# Patient Record
Sex: Male | Born: 1950 | Race: White | Hispanic: No | Marital: Married | Smoking: Former smoker
Health system: Southern US, Community
[De-identification: ages and names within clinical notes are randomized; demographics above are authoritative.]

## PROBLEM LIST (undated history)

## (undated) DIAGNOSIS — K5792 Diverticulitis of intestine, part unspecified, without perforation or abscess without bleeding: Secondary | ICD-10-CM

## (undated) DIAGNOSIS — M1990 Unspecified osteoarthritis, unspecified site: Secondary | ICD-10-CM

---

## 2004-02-08 ENCOUNTER — Emergency Department (HOSPITAL_COMMUNITY): Admission: EM | Admit: 2004-02-08 | Discharge: 2004-02-09 | Payer: Self-pay | Admitting: Emergency Medicine

## 2014-09-25 ENCOUNTER — Ambulatory Visit (INDEPENDENT_AMBULATORY_CARE_PROVIDER_SITE_OTHER): Payer: Medicare Other | Admitting: Otolaryngology

## 2014-09-25 DIAGNOSIS — J343 Hypertrophy of nasal turbinates: Secondary | ICD-10-CM | POA: Diagnosis not present

## 2014-09-25 DIAGNOSIS — J342 Deviated nasal septum: Secondary | ICD-10-CM

## 2014-09-25 DIAGNOSIS — J310 Chronic rhinitis: Secondary | ICD-10-CM | POA: Diagnosis not present

## 2014-09-25 DIAGNOSIS — J321 Chronic frontal sinusitis: Secondary | ICD-10-CM | POA: Diagnosis not present

## 2014-09-26 ENCOUNTER — Other Ambulatory Visit (INDEPENDENT_AMBULATORY_CARE_PROVIDER_SITE_OTHER): Payer: Self-pay | Admitting: Otolaryngology

## 2014-09-26 DIAGNOSIS — J329 Chronic sinusitis, unspecified: Secondary | ICD-10-CM

## 2014-10-03 ENCOUNTER — Ambulatory Visit (HOSPITAL_COMMUNITY)
Admission: RE | Admit: 2014-10-03 | Discharge: 2014-10-03 | Disposition: A | Discharge status: Home / Self Care | Payer: Medicare Other | Source: Ambulatory Visit | Attending: Otolaryngology | Admitting: Otolaryngology

## 2014-10-03 DIAGNOSIS — J329 Chronic sinusitis, unspecified: Secondary | ICD-10-CM | POA: Diagnosis not present

## 2014-10-03 DIAGNOSIS — R51 Headache: Secondary | ICD-10-CM | POA: Diagnosis not present

## 2014-10-16 ENCOUNTER — Ambulatory Visit (INDEPENDENT_AMBULATORY_CARE_PROVIDER_SITE_OTHER): Payer: Medicare Other | Admitting: Otolaryngology

## 2014-10-16 DIAGNOSIS — J310 Chronic rhinitis: Secondary | ICD-10-CM

## 2014-10-16 DIAGNOSIS — J343 Hypertrophy of nasal turbinates: Secondary | ICD-10-CM | POA: Diagnosis not present

## 2019-04-17 ENCOUNTER — Other Ambulatory Visit: Payer: Self-pay | Admitting: *Deleted

## 2019-04-17 DIAGNOSIS — Z20822 Contact with and (suspected) exposure to covid-19: Secondary | ICD-10-CM

## 2019-04-19 ENCOUNTER — Telehealth: Payer: Self-pay | Admitting: General Practice

## 2019-04-19 LAB — NOVEL CORONAVIRUS, NAA: SARS-CoV-2, NAA: NOT DETECTED

## 2019-04-19 NOTE — Telephone Encounter (Signed)
Negative COVID results given. Patient results "NOT Detected." Caller expressed understanding. ° °

## 2021-01-10 ENCOUNTER — Inpatient Hospital Stay (HOSPITAL_COMMUNITY)
Admission: EM | Admit: 2021-01-10 | Discharge: 2021-01-18 | DRG: 329 | Disposition: A | Discharge status: Home / Self Care | Payer: Medicare Other | Attending: General Surgery | Admitting: General Surgery

## 2021-01-10 ENCOUNTER — Other Ambulatory Visit: Payer: Self-pay

## 2021-01-10 ENCOUNTER — Emergency Department (HOSPITAL_COMMUNITY): Payer: Medicare Other

## 2021-01-10 ENCOUNTER — Encounter (HOSPITAL_COMMUNITY): Payer: Self-pay | Admitting: Emergency Medicine

## 2021-01-10 DIAGNOSIS — Z87891 Personal history of nicotine dependence: Secondary | ICD-10-CM

## 2021-01-10 DIAGNOSIS — Z20822 Contact with and (suspected) exposure to covid-19: Secondary | ICD-10-CM | POA: Diagnosis present

## 2021-01-10 DIAGNOSIS — D62 Acute posthemorrhagic anemia: Secondary | ICD-10-CM

## 2021-01-10 DIAGNOSIS — K661 Hemoperitoneum: Secondary | ICD-10-CM | POA: Diagnosis present

## 2021-01-10 DIAGNOSIS — K5730 Diverticulosis of large intestine without perforation or abscess without bleeding: Secondary | ICD-10-CM | POA: Diagnosis present

## 2021-01-10 DIAGNOSIS — Z801 Family history of malignant neoplasm of trachea, bronchus and lung: Secondary | ICD-10-CM

## 2021-01-10 DIAGNOSIS — M1990 Unspecified osteoarthritis, unspecified site: Secondary | ICD-10-CM | POA: Diagnosis present

## 2021-01-10 DIAGNOSIS — Z886 Allergy status to analgesic agent status: Secondary | ICD-10-CM

## 2021-01-10 DIAGNOSIS — R1900 Intra-abdominal and pelvic swelling, mass and lump, unspecified site: Secondary | ICD-10-CM | POA: Diagnosis not present

## 2021-01-10 DIAGNOSIS — R109 Unspecified abdominal pain: Secondary | ICD-10-CM

## 2021-01-10 DIAGNOSIS — C49A3 Gastrointestinal stromal tumor of small intestine: Secondary | ICD-10-CM | POA: Diagnosis not present

## 2021-01-10 DIAGNOSIS — K5900 Constipation, unspecified: Secondary | ICD-10-CM | POA: Diagnosis not present

## 2021-01-10 DIAGNOSIS — R911 Solitary pulmonary nodule: Secondary | ICD-10-CM

## 2021-01-10 DIAGNOSIS — R55 Syncope and collapse: Secondary | ICD-10-CM | POA: Diagnosis present

## 2021-01-10 DIAGNOSIS — Z79899 Other long term (current) drug therapy: Secondary | ICD-10-CM

## 2021-01-10 DIAGNOSIS — Z8041 Family history of malignant neoplasm of ovary: Secondary | ICD-10-CM

## 2021-01-10 DIAGNOSIS — Z7982 Long term (current) use of aspirin: Secondary | ICD-10-CM

## 2021-01-10 DIAGNOSIS — R1903 Right lower quadrant abdominal swelling, mass and lump: Secondary | ICD-10-CM

## 2021-01-10 HISTORY — DX: Diverticulitis of intestine, part unspecified, without perforation or abscess without bleeding: K57.92

## 2021-01-10 HISTORY — DX: Unspecified osteoarthritis, unspecified site: M19.90

## 2021-01-10 LAB — CBC WITH DIFFERENTIAL/PLATELET
Abs Immature Granulocytes: 0.08 10*3/uL — ABNORMAL HIGH (ref 0.00–0.07)
Basophils Absolute: 0.1 10*3/uL (ref 0.0–0.1)
Basophils Relative: 1 %
Eosinophils Absolute: 0.2 10*3/uL (ref 0.0–0.5)
Eosinophils Relative: 1 %
HCT: 38.8 % — ABNORMAL LOW (ref 39.0–52.0)
Hemoglobin: 12 g/dL — ABNORMAL LOW (ref 13.0–17.0)
Immature Granulocytes: 1 %
Lymphocytes Relative: 17 %
Lymphs Abs: 2.6 10*3/uL (ref 0.7–4.0)
MCH: 22.4 pg — ABNORMAL LOW (ref 26.0–34.0)
MCHC: 30.9 g/dL (ref 30.0–36.0)
MCV: 72.5 fL — ABNORMAL LOW (ref 80.0–100.0)
Monocytes Absolute: 0.9 10*3/uL (ref 0.1–1.0)
Monocytes Relative: 6 %
Neutro Abs: 11.5 10*3/uL — ABNORMAL HIGH (ref 1.7–7.7)
Neutrophils Relative %: 74 %
Platelets: 268 10*3/uL (ref 150–400)
RBC: 5.35 MIL/uL (ref 4.22–5.81)
RDW: 14.5 % (ref 11.5–15.5)
WBC: 15.3 10*3/uL — ABNORMAL HIGH (ref 4.0–10.5)
nRBC: 0 % (ref 0.0–0.2)

## 2021-01-10 LAB — COMPREHENSIVE METABOLIC PANEL
ALT: 16 U/L (ref 0–44)
AST: 35 U/L (ref 15–41)
Albumin: 3.5 g/dL (ref 3.5–5.0)
Alkaline Phosphatase: 51 U/L (ref 38–126)
Anion gap: 6 (ref 5–15)
BUN: 16 mg/dL (ref 8–23)
CO2: 19 mmol/L — ABNORMAL LOW (ref 22–32)
Calcium: 8.4 mg/dL — ABNORMAL LOW (ref 8.9–10.3)
Chloride: 111 mmol/L (ref 98–111)
Creatinine, Ser: 1.25 mg/dL — ABNORMAL HIGH (ref 0.61–1.24)
GFR, Estimated: >60 mL/min (ref >60)
Glucose, Bld: 158 mg/dL — ABNORMAL HIGH (ref 70–99)
Potassium: 4.4 mmol/L (ref 3.5–5.1)
Sodium: 136 mmol/L (ref 135–145)
Total Bilirubin: 0.7 mg/dL (ref 0.3–1.2)
Total Protein: 6.2 g/dL — ABNORMAL LOW (ref 6.5–8.1)

## 2021-01-10 LAB — RESP PANEL BY RT-PCR (FLU A&B, COVID) ARPGX2
Influenza A by PCR: NEGATIVE
Influenza B by PCR: NEGATIVE
SARS Coronavirus 2 by RT PCR: NEGATIVE

## 2021-01-10 LAB — LIPASE, BLOOD: Lipase: 31 U/L (ref 11–51)

## 2021-01-10 LAB — URINALYSIS, ROUTINE W REFLEX MICROSCOPIC
Bilirubin Urine: NEGATIVE
Glucose, UA: NEGATIVE mg/dL
Hgb urine dipstick: NEGATIVE
Ketones, ur: NEGATIVE mg/dL
Leukocytes,Ua: NEGATIVE
Nitrite: NEGATIVE
Protein, ur: NEGATIVE mg/dL
Specific Gravity, Urine: >1.046 — ABNORMAL HIGH (ref 1.005–1.030)
pH: 5 (ref 5.0–8.0)

## 2021-01-10 LAB — POC OCCULT BLOOD, ED: Fecal Occult Bld: NEGATIVE

## 2021-01-10 LAB — TROPONIN I (HIGH SENSITIVITY): Troponin I (High Sensitivity): 5 ng/L (ref <18)

## 2021-01-10 MED ORDER — SODIUM CHLORIDE 0.9 % IV SOLN: INTRAVENOUS | Status: AC

## 2021-01-10 MED ORDER — ONDANSETRON HCL 4 MG PO TABS: 4.0000 mg | ORAL_TABLET | Freq: Four times a day (QID) | ORAL | Status: DC | PRN

## 2021-01-10 MED ORDER — ACETAMINOPHEN 650 MG RE SUPP: 650.0000 mg | Freq: Four times a day (QID) | RECTAL | Status: DC | PRN

## 2021-01-10 MED ORDER — ACETAMINOPHEN 325 MG PO TABS: 650.0000 mg | ORAL_TABLET | Freq: Four times a day (QID) | ORAL | Status: DC | PRN

## 2021-01-10 MED ORDER — POLYETHYLENE GLYCOL 3350 17 G PO PACK
17.0000 g | PACK | Freq: Every day | ORAL | Status: DC | PRN
Start: 2021-01-10 — End: 2021-01-12

## 2021-01-10 MED ORDER — ONDANSETRON HCL 4 MG/2ML IJ SOLN: 4.0000 mg | Freq: Four times a day (QID) | INTRAMUSCULAR | Status: DC | PRN

## 2021-01-10 MED ORDER — FLUTICASONE PROPIONATE 50 MCG/ACT NA SUSP
1.0000 | Freq: Every day | NASAL | Status: DC
  Administered 2021-01-16 – 2021-01-18 (×3): 1 via NASAL
  Filled 2021-01-10: qty 16

## 2021-01-10 MED ORDER — IOHEXOL 300 MG/ML  SOLN
100.0000 mL | Freq: Once | INTRAMUSCULAR | Status: AC | PRN
  Administered 2021-01-10: 100 mL via INTRAVENOUS

## 2021-01-10 MED ORDER — MORPHINE SULFATE (PF) 4 MG/ML IV SOLN
4.0000 mg | Freq: Once | INTRAVENOUS | Status: AC
  Administered 2021-01-10: 4 mg via INTRAVENOUS
  Filled 2021-01-10: qty 1

## 2021-01-10 MED ORDER — ONDANSETRON HCL 4 MG/2ML IJ SOLN: 4.0000 mg | Freq: Once | INTRAMUSCULAR | Status: DC

## 2021-01-10 MED ORDER — LORAZEPAM 2 MG/ML IJ SOLN
0.5000 mg | Freq: Once | INTRAMUSCULAR | Status: AC
  Administered 2021-01-11: 0.5 mg via INTRAVENOUS
  Filled 2021-01-10: qty 1

## 2021-01-10 MED ORDER — SODIUM CHLORIDE 0.9 % IV BOLUS
500.0000 mL | Freq: Once | INTRAVENOUS | Status: AC
  Administered 2021-01-10: 500 mL via INTRAVENOUS

## 2021-01-10 MED ORDER — ENOXAPARIN SODIUM 40 MG/0.4ML IJ SOSY
40.0000 mg | PREFILLED_SYRINGE | INTRAMUSCULAR | Status: DC
  Administered 2021-01-10 – 2021-01-11 (×2): 40 mg via SUBCUTANEOUS
  Filled 2021-01-10: qty 0.4

## 2021-01-10 NOTE — H&P (Signed)
History and Physical    Ross Duke [id number] DOB: 1951-01-25 DOA: 01/10/2021  PCP: System, Provider Not In   Patient coming from: Home  I have personally briefly reviewed patient's old medical records in [place]  Chief Complaint: Passing out, Abd pain  HPI: Ross Duke is a 70 y.o. male with no significant medical history significant who presented to the ED with complaints of abdominal pain and 3 episodes of passing out.   Patient reports onset of abdominal pain today, and 3 episodes of soft slightly loose stools.  He denies diarrhea, stools were not watery.  Over the past few months patient has had intermittent abdominal pain mostly left-sided, and lower abdomen, but today he had it on the right side.  He reports episodes of spontaneous flushing, and feeling hot over the past few months.  Had an episode of diarrhea last EGD about a month ago.  Recent prescription of amoxicillin for sinus infection about a month ago.  Today patient had 3 episodes of passing out, spouse was present each time, she reports patient was about to vomit-of which he had 3 episodes (of vomiting ) today.  The first time patient was upright, bent over the commode, when he almost passed out, with his eyes rolling upward, spouse assisted him but he did not fall.  The first time, patient was shaking all over, but not jerking.  Patient was became diaphoretic the 3 times he passed out.  Patient was out for a few seconds at a time and quickly recovered.  No previous episodes.  No chest pain, no difficulty breathing.  Patient has kept up with his colonoscopies as recommended.  Per spouse, patient quit smoking cigarettes at least 15 years ago.  ED Course: Temperature 97.4.  Heart rate 60s.  Blood pressure 111-136.  WBC 15.3.  Abdominal CT shows 8.1 x 7.1 cm abnormal soft tissue mass anteriorly in the pelvis, with surrounding ascites concerning for neoplasm or malignancy such as carcinoid or desmoid tumor.  MRI  recommended. 500 mill bolus given.  Hospitalist to admit.  Review of Systems: As per HPI all other systems reviewed and negative.  Past Medical History:  Diagnosis Date   Arthritis     History reviewed. No pertinent surgical history.   reports that he quit smoking about 4 years ago. His smoking use included cigarettes. He has a 30.00 pack-year smoking history. He does not have any smokeless tobacco history on file. He reports previous alcohol use. He reports that he does not use drugs.  Allergies  Allergen Reactions   Naproxen Sodium     Makes him twitchy     Prior to Admission medications   Medication Sig Start Date End Date Taking? Authorizing Provider  aspirin 81 MG EC tablet Take 1 tablet by mouth daily. 07/08/15  Yes [provider]  fluticasone (FLONASE) 50 MCG/ACT nasal spray Place 1 spray into both nostrils daily. 05/28/15  Yes [provider]  loratadine (CLARITIN) 10 MG tablet Take 10 mg by mouth daily.   Yes [provider]  oxymetazoline (AFRIN) 0.05 % nasal spray Place 1 spray into both nostrils 2 (two) times daily.   Yes [provider]  azithromycin (ZITHROMAX) 250 MG tablet azithromycin 250 mg tablet  TAKE 2 TABLETS BY MOUTH TODAY, THEN TAKE 1 TABLET DAILY FOR 4 DAYS Patient not taking: No sig reported    [provider]  Nasal Moisturizer Combination (OCEAN COMPLETE SINUS RINSE) AERS Place into the nose. Patient  not taking: No sig reported 09/17/15   [provider]  predniSONE (DELTASONE) 20 MG tablet prednisone 20 mg tablet  TAKE 3 TABLETS BY MOUTH EVERY DAY FOR 3 DAYS,THEN TAKE 1 TABLET BY MOUTH EVERY DAY FOR 3 DAYS Patient not taking: No sig reported    [provider]    Physical Exam: Vitals:   01/10/21 1200 01/10/21 1230 01/10/21 1400 01/10/21 1430  BP: 111/81 134/85 136/85 136/87  Pulse: 66 61 65 62  Resp: '14 16 14 14  '$ Temp:      TempSrc:      SpO2: 96% 97% 98% 96%  Weight:      Height:         Constitutional: NAD, calm, comfortable Vitals:   01/10/21 1200 01/10/21 1230 01/10/21 1400 01/10/21 1430  BP: 111/81 134/85 136/85 136/87  Pulse: 66 61 65 62  Resp: '14 16 14 14  '$ Temp:      TempSrc:      SpO2: 96% 97% 98% 96%  Weight:      Height:       Eyes: PERRL, lids and conjunctivae normal ENMT: Mucous membranes are mildly dry.  Neck: normal, supple, no masses, no thyromegaly Respiratory: clear to auscultation bilaterally, no wheezing, no crackles. Normal respiratory effort. No accessory muscle use.  Cardiovascular: Regular rate and rhythm, no murmurs / rubs / gallops. No extremity edema. 2+ pedal pulses.  Abdomen: Mild left-sided and suprapubic pubic tenderness, abdomen soft, no masses palpated. No hepatosplenomegaly.  Musculoskeletal: no clubbing / cyanosis. No joint deformity upper and lower extremities. Good ROM, no contractures. Normal muscle tone.  Skin: no rashes, lesions, ulcers. No induration Neurologic: No apparent cranial nerve abnormality, moving extremities spontaneously Psychiatric: Normal judgment and insight. Alert and oriented x 3. Normal mood.   Labs on Admission: I have personally reviewed following labs and imaging studies  CBC: Recent Labs  Lab 01/10/21 1130  WBC 15.3*  NEUTROABS 11.5*  HGB 12.0*  HCT 38.8*  MCV 72.5*  PLT [id number]   Basic Metabolic Panel: Recent Labs  Lab 01/10/21 1130  NA 136  K 4.4  CL 111  CO2 19*  GLUCOSE 158*  BUN 16  CREATININE 1.25*  CALCIUM 8.4*   Liver Function Tests: Recent Labs  Lab 01/10/21 1130  AST 35  ALT 16  ALKPHOS 51  BILITOT 0.7  PROT 6.2*  ALBUMIN 3.5   Recent Labs  Lab 01/10/21 1130  LIPASE 31    Radiological Exams on Admission: CT ABDOMEN PELVIS W CONTRAST  Result Date: 01/10/2021 CLINICAL DATA:  Acute lower abdominal pain. EXAM: CT ABDOMEN AND PELVIS WITH CONTRAST TECHNIQUE: Multidetector CT imaging of the abdomen and pelvis was performed using the standard protocol following  bolus administration of intravenous contrast. CONTRAST:  142m OMNIPAQUE IOHEXOL 300 MG/ML  SOLN COMPARISON:  None. FINDINGS: Lower chest: 10 x 5 mm nodule is noted in right lower lobe which may represent intrapulmonary lymph node. Hepatobiliary: No gallstones or biliary dilatation is noted. Multiple low densities are noted throughout hepatic parenchyma most consistent with hepatic cysts. Pancreas: Unremarkable. No pancreatic ductal dilatation or surrounding inflammatory changes. Spleen: Normal in size without focal abnormality. Adrenals/Urinary Tract: Adrenal glands appear normal. Left renal cyst is noted. No hydronephrosis or renal obstruction is noted. No renal or ureteral calculi are noted. Urinary bladder is unremarkable. Stomach/Bowel: Stomach is within normal limits. Appendix appears normal. No evidence of bowel wall thickening, distention, or inflammatory changes. Diverticulosis of descending and sigmoid colon is noted. Vascular/Lymphatic: Aortic  atherosclerosis. No enlarged abdominal or pelvic lymph nodes. Reproductive: Prostate is unremarkable. Other: Mild ascites is noted in the pelvis, right pericolic gutter and around the liver. 8.1 x 7.1 cm abnormal soft tissue density is noted anteriorly in the pelvis with peripheral enhancement and surrounding ascites. This is highly concerning for neoplasm or malignancy, potentially mesenteric in origin. Musculoskeletal: No acute or significant osseous findings. IMPRESSION: 8.1 x 7.1 cm abnormal soft tissue density is noted anteriorly in the pelvis with peripheral enhancement and surrounding ascites, and this is concerning for neoplasm or malignancy such as carcinoid or desmoid tumor. MRI with and without gadolinium is recommended for further evaluation. Diverticulosis of descending and sigmoid colon is noted without inflammation. Approximately 8 mm nodule is noted in right lower lobe which may represent intrapulmonary lymph node, but pulmonary nodule cannot be  excluded. Non-contrast chest CT at 6-12 months is recommended. If the nodule is stable at time of repeat CT, then future CT at 18-24 months (from today's scan) is considered optional for low-risk patients, but is recommended for high-risk patients. This recommendation follows the consensus statement: Guidelines for Management of Incidental Pulmonary Nodules Detected on CT Images: From the Fleischner Society 2017; Radiology 2017; 284:228-243. Aortic Atherosclerosis (ICD10-I70.0). Electronically Signed   By: Marijo Conception M.D.   On: 01/10/2021 12:33    EKG: Independently reviewed.  Sinus rhythm rate 72.  QTc 423.  Nonspecific T wave abnormalities in lead I and aVL.  Assessment/Plan Principal Problem:   Syncope Active Problems:   Abdominal mass  Syncope-3 episodes just before each episode of vomiting.  Likely vasovagal etiology.  In the setting of possible carcinoid/desmoid tumor.  Denies diarrhea.  Creatinine stable. -Trend troponin -Obtain echocardiogram - 500 mill bolus given, continue N/s 100cc/hr x 15hrs - Check orthostatic vitals  Abdominal mass- 8.1 x 7.1 cm abnormal soft tissue density is noted anteriorly in the pelvis with peripheral enhancement and surrounding ascites, and this is concerning for neoplasm or malignancy such as carcinoid or desmoid tumor.  - MRI with and without gadolinium ordered as recommended  Incidental pulmonary nodule on CT- Approximately 8 mm nodule is noted in right lower lobe which may represent intrapulmonary lymph node, but pulmonary nodule cannot be excluded. Non-contrast chest CT at 6-12 months is recommended. If the nodule is stable at time of repeat CT, then future CT at 18-24 months (from today's scan) is considered optional for low-risk patients, but is recommended for high-risk patients. - Problem list updated follow-up as outpatient  Leukocytosis of 15.3.  Possibly stress reaction -Trend  DVT prophylaxis Lovenox Code Status: Full code Family  Communication: Spouse at bedside Disposition Plan:  ~ 2 days pending results of MRI and if inpatient versus outpatient work-up was recommended Consults called: None Admission status: Obs, tele    Bethena Roys MD [facility]  01/10/2021, 5:28 PM

## 2021-01-10 NOTE — ED Notes (Signed)
Attempted to call report x 1  

## 2021-01-10 NOTE — ED Provider Notes (Signed)
  Face-to-face evaluation   History: He presents for evaluation of an episode of syncope after sitting on the commode.  He was standing, felt weak and almost passed out.  His wife assisted him to sit on the commode and she feels that he lost consciousness for a few seconds.  There was no fall or injury.  He has had intermittent lower abdominal pain and thought he was constipated on and off for several weeks.  He is also concerned that he does not only chew food well and wonders if the shrimp he ate yesterday is causing his current problem.  He denies fever, chills, cough or shortness of breath.  He has had COVID vaccines.  No prior similar problems.  Physical exam: Alert elderly male.  He is lucid.  Abdomen soft with mild tenderness right lower quadrant.  No rebound tenderness, no palpable mass.  No respiratory distress.  Medical screening examination/treatment/procedure(s) were conducted as a shared visit with non-physician practitioner(s) and myself.  I personally evaluated the patient during the encounter    Daleen Bo, MD 01/10/21 1357

## 2021-01-10 NOTE — ED Provider Notes (Signed)
[facility] EMERGENCY DEPARTMENT Provider Note   CSN: [id number] Arrival date & time: 01/10/21  1050     History Chief Complaint  Patient presents with   Abdominal Pain    Ross Duke is a 70 y.o. male presenting for evaluation of abdominal pain, nausea, vomiting and multiple episodes of soft to diarrheal stools which started around 4 am today.  He reports waxing and waning severe abdominal pain sometimes in his RUQ at times radiating across his umbilicus.  Prior to arrival, his wife found him on the toilet and she had to wake him up. It is unclear if he simply fell asleep or if he passed out. When she woke him, he was too weak to stand and EMS had to help him. He denies frank blood in his stool, reports one bm which was darker than normal, not black or tarry.  He denies hematemesis.  Ate [place] food at a food court ytd, but wife ate same and is sx free.  No fevers or chills, no cough or cp but does feel sob since sx began.  He was given zofran prior to arrival and currently has no nausea.  The history is provided by the patient.      Past Medical History:  Diagnosis Date   Arthritis     Patient Active Problem List   Diagnosis Date Noted   Abdominal mass 01/10/2021    History reviewed. No pertinent surgical history.     History reviewed. No pertinent family history.  Social History   Tobacco Use   Smoking status: Former    Packs/day: 1.00    Years: 30.00    Pack years: 30.00    Types: Cigarettes    Quit date: 12/19/2016    Years since quitting: 4.0  Substance Use Topics   Alcohol use: Not Currently   Drug use: Never    Home Medications Prior to Admission medications   Medication Sig Start Date End Date Taking? Authorizing Provider  aspirin 81 MG EC tablet Take 1 tablet by mouth daily. 07/08/15  Yes [provider]  fluticasone (FLONASE) 50 MCG/ACT nasal spray Place 1 spray into both nostrils daily. 05/28/15  Yes [provider]  loratadine  (CLARITIN) 10 MG tablet Take 10 mg by mouth daily.   Yes [provider]  oxymetazoline (AFRIN) 0.05 % nasal spray Place 1 spray into both nostrils 2 (two) times daily.   Yes [provider]  azithromycin (ZITHROMAX) 250 MG tablet azithromycin 250 mg tablet  TAKE 2 TABLETS BY MOUTH TODAY, THEN TAKE 1 TABLET DAILY FOR 4 DAYS Patient not taking: No sig reported    [provider]  Nasal Moisturizer Combination (OCEAN COMPLETE SINUS RINSE) AERS Place into the nose. Patient not taking: No sig reported 09/17/15   [provider]  predniSONE (DELTASONE) 20 MG tablet prednisone 20 mg tablet  TAKE 3 TABLETS BY MOUTH EVERY DAY FOR 3 DAYS,THEN TAKE 1 TABLET BY MOUTH EVERY DAY FOR 3 DAYS Patient not taking: No sig reported    [provider]    Allergies    Naproxen sodium  Review of Systems   Review of Systems  Constitutional:  Negative for chills and fever.  HENT: Negative.    Eyes: Negative.   Respiratory:  Positive for shortness of breath. Negative for chest tightness.   Cardiovascular:  Negative for chest pain and palpitations.  Gastrointestinal:  Positive for abdominal pain, diarrhea, nausea and vomiting.  Genitourinary: Negative.   Musculoskeletal:  Negative for arthralgias, joint swelling and neck pain.  Skin: Negative.  Negative for rash and wound.  Neurological:  Positive for weakness. Negative for dizziness, light-headedness, numbness and headaches.  Psychiatric/Behavioral: Negative.    All other systems reviewed and are negative.  Physical Exam Updated Vital Signs BP 136/87   Pulse 62   Temp (!) 97.4 F (36.3 C) (Oral)   Resp 14   Ht '5\' 6"'$  (1.676 m)   Wt 77.1 kg   SpO2 96%   BMI 27.44 kg/m   Physical Exam Vitals and nursing note reviewed.  Constitutional:      Appearance: He is well-developed.  HENT:     Head: Normocephalic and atraumatic.  Eyes:     Conjunctiva/sclera: Conjunctivae normal.  Cardiovascular:     Rate and  Rhythm: Normal rate and regular rhythm.     Heart sounds: Normal heart sounds.  Pulmonary:     Effort: Pulmonary effort is normal.     Breath sounds: Normal breath sounds. No wheezing.  Abdominal:     General: Bowel sounds are normal.     Palpations: Abdomen is soft.     Tenderness: There is abdominal tenderness in the right upper quadrant, periumbilical area, left upper quadrant and left lower quadrant. There is no guarding or rebound.     Hernia: No hernia is present.  Musculoskeletal:        General: Normal range of motion.     Cervical back: Normal range of motion.  Skin:    General: Skin is warm and dry.  Neurological:     Mental Status: He is alert.    ED Results / Procedures / Treatments   Labs (all labs ordered are listed, but only abnormal results are displayed) Labs Reviewed  COMPREHENSIVE METABOLIC PANEL - Abnormal; Notable for the following components:      Result Value   CO2 19 (*)    Glucose, Bld 158 (*)    Creatinine, Ser 1.25 (*)    Calcium 8.4 (*)    Total Protein 6.2 (*)    All other components within normal limits  CBC WITH DIFFERENTIAL/PLATELET - Abnormal; Notable for the following components:   WBC 15.3 (*)    Hemoglobin 12.0 (*)    HCT 38.8 (*)    MCV 72.5 (*)    MCH 22.4 (*)    Neutro Abs 11.5 (*)    Abs Immature Granulocytes 0.08 (*)    All other components within normal limits  LIPASE, BLOOD  URINALYSIS, ROUTINE W REFLEX MICROSCOPIC  POC OCCULT BLOOD, ED    EKG EKG Interpretation  Date/Time:  Sunday January 10 2021 10:57:48 EDT Ventricular Rate:  72 PR Interval:  168 QRS Duration: 87 QT Interval:  386 QTC Calculation: 423 R Axis:   -42 Text Interpretation: Sinus rhythm Left axis deviation No old tracing to compare Confirmed by Daleen Bo [phone]) on 01/10/2021 12:00:55 PM  Radiology CT ABDOMEN PELVIS W CONTRAST  Result Date: 01/10/2021 CLINICAL DATA:  Acute lower abdominal pain. EXAM: CT ABDOMEN AND PELVIS WITH CONTRAST TECHNIQUE:  Multidetector CT imaging of the abdomen and pelvis was performed using the standard protocol following bolus administration of intravenous contrast. CONTRAST:  116m OMNIPAQUE IOHEXOL 300 MG/ML  SOLN COMPARISON:  None. FINDINGS: Lower chest: 10 x 5 mm nodule is noted in right lower lobe which may represent intrapulmonary lymph node. Hepatobiliary: No gallstones or biliary dilatation is noted. Multiple low densities are noted throughout hepatic parenchyma most consistent with hepatic cysts. Pancreas: Unremarkable.  No pancreatic ductal dilatation or surrounding inflammatory changes. Spleen: Normal in size without focal abnormality. Adrenals/Urinary Tract: Adrenal glands appear normal. Left renal cyst is noted. No hydronephrosis or renal obstruction is noted. No renal or ureteral calculi are noted. Urinary bladder is unremarkable. Stomach/Bowel: Stomach is within normal limits. Appendix appears normal. No evidence of bowel wall thickening, distention, or inflammatory changes. Diverticulosis of descending and sigmoid colon is noted. Vascular/Lymphatic: Aortic atherosclerosis. No enlarged abdominal or pelvic lymph nodes. Reproductive: Prostate is unremarkable. Other: Mild ascites is noted in the pelvis, right pericolic gutter and around the liver. 8.1 x 7.1 cm abnormal soft tissue density is noted anteriorly in the pelvis with peripheral enhancement and surrounding ascites. This is highly concerning for neoplasm or malignancy, potentially mesenteric in origin. Musculoskeletal: No acute or significant osseous findings. IMPRESSION: 8.1 x 7.1 cm abnormal soft tissue density is noted anteriorly in the pelvis with peripheral enhancement and surrounding ascites, and this is concerning for neoplasm or malignancy such as carcinoid or desmoid tumor. MRI with and without gadolinium is recommended for further evaluation. Diverticulosis of descending and sigmoid colon is noted without inflammation. Approximately 8 mm nodule is noted  in right lower lobe which may represent intrapulmonary lymph node, but pulmonary nodule cannot be excluded. Non-contrast chest CT at 6-12 months is recommended. If the nodule is stable at time of repeat CT, then future CT at 18-24 months (from today's scan) is considered optional for low-risk patients, but is recommended for high-risk patients. This recommendation follows the consensus statement: Guidelines for Management of Incidental Pulmonary Nodules Detected on CT Images: From the Fleischner Society 2017; Radiology 2017; 284:228-243. Aortic Atherosclerosis (ICD10-I70.0). Electronically Signed   By: Marijo Conception M.D.   On: 01/10/2021 12:33    Procedures Procedures   Medications Ordered in ED Medications  morphine 4 MG/ML injection 4 mg (4 mg Intravenous Given 01/10/21 1149)  sodium chloride 0.9 % bolus 500 mL (0 mLs Intravenous Stopped 01/10/21 1234)  iohexol (OMNIPAQUE) 300 MG/ML solution 100 mL (100 mLs Intravenous Contrast Given 01/10/21 1219)    ED Course  I have reviewed the triage vital signs and the nursing notes.  Pertinent labs & imaging results that were available during my care of the patient were reviewed by me and considered in my medical decision making (see chart for details).    MDM Rules/Calculators/A&P                           Labs and imaging reviewed and discussed with patient.  Abnormal labs include a CO2 of 19 which is reduced, he does not have an anion gap, he does not have a history of diabetes, although he does have an elevated glucose at 158 today.  He also has a elevated creatinine 1.25.  There are no prior labs to compare this number, it is unclear if this is patient's baseline.  He has an elevated WBC count as well at 15.3.  CT abdomen pelvis is significant for a mass in the right lower quadrant concerning for malignancy such as carcinoid versus a desmoid tumor.  There is also ascites found surrounding his liver.  I discussed these findings with Dr. Constance Haw of  general surgery.  She agrees with plan for admission and MRI imaging in the morning and further management discussion after MRI results.  She has asked for a hospitalist admission.  Discussed with Dr. Denton Brick who accepts patient for admission. Final Clinical Impression(s) / ED  Diagnoses Final diagnoses:  Abdominal pain, unspecified abdominal location  Right lower quadrant abdominal mass    Rx / DC Orders ED Discharge Orders     None        Landis Martins 01/10/21 [place]    Daleen Bo, MD 01/11/21 [phone]

## 2021-01-10 NOTE — ED Triage Notes (Signed)
Pt c/o of abdominal pain that started at 4am today. Pt has N/V/D. Pt got '4mg'$  of zofran w/ EMS.

## 2021-01-10 NOTE — ED Notes (Signed)
Urine sample requested. Urinal at pts bedside.

## 2021-01-10 NOTE — ED Notes (Signed)
Patient transported to CT 

## 2021-01-11 ENCOUNTER — Observation Stay (HOSPITAL_COMMUNITY): Payer: Medicare Other

## 2021-01-11 ENCOUNTER — Encounter (HOSPITAL_COMMUNITY): Payer: Self-pay | Admitting: Family Medicine

## 2021-01-11 DIAGNOSIS — K661 Hemoperitoneum: Secondary | ICD-10-CM | POA: Diagnosis present

## 2021-01-11 DIAGNOSIS — Z79899 Other long term (current) drug therapy: Secondary | ICD-10-CM | POA: Diagnosis not present

## 2021-01-11 DIAGNOSIS — Z801 Family history of malignant neoplasm of trachea, bronchus and lung: Secondary | ICD-10-CM | POA: Diagnosis not present

## 2021-01-11 DIAGNOSIS — Z87891 Personal history of nicotine dependence: Secondary | ICD-10-CM | POA: Diagnosis not present

## 2021-01-11 DIAGNOSIS — M1990 Unspecified osteoarthritis, unspecified site: Secondary | ICD-10-CM | POA: Diagnosis present

## 2021-01-11 DIAGNOSIS — R55 Syncope and collapse: Secondary | ICD-10-CM | POA: Diagnosis present

## 2021-01-11 DIAGNOSIS — Z8041 Family history of malignant neoplasm of ovary: Secondary | ICD-10-CM | POA: Diagnosis not present

## 2021-01-11 DIAGNOSIS — R1903 Right lower quadrant abdominal swelling, mass and lump: Secondary | ICD-10-CM | POA: Diagnosis not present

## 2021-01-11 DIAGNOSIS — R1900 Intra-abdominal and pelvic swelling, mass and lump, unspecified site: Secondary | ICD-10-CM | POA: Diagnosis not present

## 2021-01-11 DIAGNOSIS — Z7982 Long term (current) use of aspirin: Secondary | ICD-10-CM | POA: Diagnosis not present

## 2021-01-11 DIAGNOSIS — C49A3 Gastrointestinal stromal tumor of small intestine: Secondary | ICD-10-CM | POA: Diagnosis present

## 2021-01-11 DIAGNOSIS — K5900 Constipation, unspecified: Secondary | ICD-10-CM | POA: Diagnosis not present

## 2021-01-11 DIAGNOSIS — D62 Acute posthemorrhagic anemia: Secondary | ICD-10-CM | POA: Diagnosis present

## 2021-01-11 DIAGNOSIS — Z886 Allergy status to analgesic agent status: Secondary | ICD-10-CM | POA: Diagnosis not present

## 2021-01-11 DIAGNOSIS — R911 Solitary pulmonary nodule: Secondary | ICD-10-CM | POA: Diagnosis present

## 2021-01-11 DIAGNOSIS — Z20822 Contact with and (suspected) exposure to covid-19: Secondary | ICD-10-CM | POA: Diagnosis present

## 2021-01-11 DIAGNOSIS — K5730 Diverticulosis of large intestine without perforation or abscess without bleeding: Secondary | ICD-10-CM | POA: Diagnosis present

## 2021-01-11 LAB — CBC
HCT: 32 % — ABNORMAL LOW (ref 39.0–52.0)
Hemoglobin: 10 g/dL — ABNORMAL LOW (ref 13.0–17.0)
MCH: 22.5 pg — ABNORMAL LOW (ref 26.0–34.0)
MCHC: 31.3 g/dL (ref 30.0–36.0)
MCV: 72.1 fL — ABNORMAL LOW (ref 80.0–100.0)
Platelets: 244 10*3/uL (ref 150–400)
RBC: 4.44 MIL/uL (ref 4.22–5.81)
RDW: 14.5 % (ref 11.5–15.5)
WBC: 10.4 10*3/uL (ref 4.0–10.5)
nRBC: 0 % (ref 0.0–0.2)

## 2021-01-11 LAB — BASIC METABOLIC PANEL
Anion gap: 6 (ref 5–15)
BUN: 14 mg/dL (ref 8–23)
CO2: 23 mmol/L (ref 22–32)
Calcium: 8.2 mg/dL — ABNORMAL LOW (ref 8.9–10.3)
Chloride: 107 mmol/L (ref 98–111)
Creatinine, Ser: 1.19 mg/dL (ref 0.61–1.24)
GFR, Estimated: >60 mL/min (ref >60)
Glucose, Bld: 97 mg/dL (ref 70–99)
Potassium: 3.7 mmol/L (ref 3.5–5.1)
Sodium: 136 mmol/L (ref 135–145)

## 2021-01-11 LAB — ECHOCARDIOGRAM COMPLETE
AR max vel: 3.38 cm2
AV Area VTI: 3.06 cm2
AV Area mean vel: 3.05 cm2
AV Mean grad: 4 mmHg
AV Peak grad: 6.4 mmHg
Ao pk vel: 1.26 m/s
Area-P 1/2: 2.81 cm2
Height: 66 in
MV VTI: 3.59 cm2
S' Lateral: 2.53 cm
Weight: 2720 oz

## 2021-01-11 LAB — TYPE AND SCREEN
ABO/RH(D): O POS
Antibody Screen: NEGATIVE

## 2021-01-11 LAB — TROPONIN I (HIGH SENSITIVITY): Troponin I (High Sensitivity): 4 ng/L (ref <18)

## 2021-01-11 MED ORDER — GADOBUTROL 1 MMOL/ML IV SOLN
7.0000 mL | Freq: Once | INTRAVENOUS | Status: AC | PRN
  Administered 2021-01-11: 7 mL via INTRAVENOUS

## 2021-01-11 MED ORDER — CHLORHEXIDINE GLUCONATE CLOTH 2 % EX PADS
6.0000 | MEDICATED_PAD | Freq: Once | CUTANEOUS | Status: AC
  Administered 2021-01-11: 6 via TOPICAL

## 2021-01-11 MED ORDER — SODIUM CHLORIDE 0.9 % IV SOLN
2.0000 g | INTRAVENOUS | Status: AC
  Administered 2021-01-12: 2 g via INTRAVENOUS
  Filled 2021-01-11 (×2): qty 2

## 2021-01-11 MED ORDER — ACETAMINOPHEN 500 MG PO TABS: 1000.0000 mg | ORAL_TABLET | ORAL | Status: DC

## 2021-01-11 NOTE — Progress Notes (Addendum)
PROGRESS NOTE   Ross Duke  [id number] DOB: 18-Jul-1950 DOA: 01/10/2021 PCP: System, Provider Not In   Chief Complaint  Patient presents with   Abdominal Pain   Level of care: Telemetry  Brief Admission History:  70 y.o. male with no significant medical history significant who presented to the ED with complaints of abdominal pain and 3 episodes of passing out.  Patient reports onset of abdominal pain today, and 3 episodes of soft slightly loose stools.  He denies diarrhea, stools were not watery.  Over the past few months patient has had intermittent abdominal pain mostly left-sided, and lower abdomen, but today he had it on the right side.  He reports episodes of spontaneous flushing, and feeling hot over the past few months.  Had an episode of diarrhea last EGD about a month ago.  Recent prescription of amoxicillin for sinus infection about a month ago.   Today patient had 3 episodes of passing out, spouse was present each time, she reports patient was about to vomit-of which he had 3 episodes (of vomiting ) today.  The first time patient was upright, bent over the commode, when he almost passed out, with his eyes rolling upward, spouse assisted him but he did not fall.  The first time, patient was shaking all over, but not jerking.  Patient was became diaphoretic the 3 times he passed out.  Patient was out for a few seconds at a time and quickly recovered.   No previous episodes.  No chest pain, no difficulty breathing.  Patient has kept up with his colonoscopies as recommended.  Per spouse, patient quit smoking cigarettes at least 15 years ago.   ED Course: Temperature 97.4.  Heart rate 60s.  Blood pressure 111-136.  WBC 15.3.  Abdominal CT shows 8.1 x 7.1 cm abnormal soft tissue mass anteriorly in the pelvis, with surrounding ascites concerning for neoplasm or malignancy such as carcinoid or desmoid tumor.  MRI recommended. 500 mill bolus given.  Hospitalist to admit.  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  MRI team discussed with abdomen specialist Dr. Kris Hartmann at [facility] and his advice was that MRI abdomen was not needed.  I shared his recommendation with surgery.  Dr. Constance Haw will see him in consultation later today.  MRI brain did NOT show evidence of intracranial metastatic disease.    Syncope - I suspect vasovagal syncope given he was actively vomiting at the time.    Constipation - laxatives ordered as needed.    Anemia unspecified - Hg down to 10.0. Following CBC daily.   Pulmonary nodule - Outpatient follow up.  DVT prophylaxis: enoxaparin  Code Status: full  Family Communication: wife updated by telephone 7/25 Disposition: anticipating home  Status is: Observation  The patient remains OBS appropriate and will d/c before 2 midnights.  Dispo: The patient is from: Home              Anticipated d/c is to: Home              Patient currently is not medically stable to d/c.   Difficult to place patient No  Consultants:  Surgery   Procedures:    Antimicrobials:  N/a    Subjective: Pt reports he continues to have abdominal pain but was able to tolerate breakfast.    Objective:  Vitals:   01/10/21 2045 01/10/21 2048 01/11/21 0209 01/11/21 0614  BP: (!) 141/85 114/80 118/75 130/79  Pulse: 88 (!) 104 86 80  Resp: '18 18 18 18  '$ Temp:   98.9 F (37.2 C) 98.2 F (36.8 C)  TempSrc:      SpO2: 98% 100% 100% 96%  Weight:      Height:        Intake/Output Summary (Last 24 hours) at 01/11/2021 1134 Last data filed at 01/11/2021 0700 Gross per 24 hour  Intake 1468.96 ml  Output --  Net 1468.96 ml   Filed Weights   01/10/21 1056  Weight: 77.1 kg    Examination:  General exam: Appears calm and comfortable  Respiratory system: Clear to auscultation. Respiratory effort normal. Cardiovascular  system: normal S1 & S2 heard. No JVD, murmurs, rubs, gallops or clicks. No pedal edema. Gastrointestinal system: Abdomen is nondistended, soft and mild periumbilical tenderness with light palpation. No organomegaly or masses felt. Normal bowel sounds heard. Central nervous system: Alert and oriented. No focal neurological deficits. Extremities: Symmetric 5 x 5 power. Skin: No rashes, lesions or ulcers Psychiatry: Judgement and insight appear normal. Mood & affect appropriate.   Data Reviewed: I have personally reviewed following labs and imaging studies  CBC: Recent Labs  Lab 01/10/21 1130 01/11/21 0453  WBC 15.3* 10.4  NEUTROABS 11.5*  --   HGB 12.0* 10.0*  HCT 38.8* 32.0*  MCV 72.5* 72.1*  PLT 268 [id number]    Basic Metabolic Panel: Recent Labs  Lab 01/10/21 1130 01/11/21 0453  NA 136 136  K 4.4 3.7  CL 111 107  CO2 19* 23  GLUCOSE 158* 97  BUN 16 14  CREATININE 1.25* 1.19  CALCIUM 8.4* 8.2*    GFR: Estimated Creatinine Clearance: 55.6 mL/min (by C-G formula based on SCr of 1.19 mg/dL).  Liver Function Tests: Recent Labs  Lab 01/10/21 1130  AST 35  ALT 16  ALKPHOS 51  BILITOT 0.7  PROT 6.2*  ALBUMIN 3.5    CBG: No results for input(s): GLUCAP in the last 168 hours.  Recent Results (from the past 240 hour(s))  Resp Panel by RT-PCR (Flu A&B, Covid) Nasopharyngeal Swab     Status: None   Collection Time: 01/10/21  3:49 PM   Specimen: Nasopharyngeal Swab; Nasopharyngeal(NP) swabs in vial transport medium  Result Value Ref Range Status   SARS Coronavirus 2 by RT PCR NEGATIVE NEGATIVE Final    Comment: (NOTE) SARS-CoV-2 target nucleic acids are NOT DETECTED.  The SARS-CoV-2 RNA is generally detectable in upper respiratory specimens during the acute phase of infection. The lowest concentration of SARS-CoV-2 viral copies this assay can detect is 138 copies/mL. A negative result does not preclude SARS-Cov-2 infection and should not be used as the sole basis for  treatment or other patient management decisions. A negative result may occur with  improper specimen collection/handling, submission of specimen other than nasopharyngeal swab, presence of viral mutation(s) within the areas targeted by this assay, and inadequate number of viral copies(<138 copies/mL). A negative result must be combined with clinical observations, patient history, and epidemiological information. The expected result is Negative.  Fact Sheet for Patients:  EntrepreneurPulse.com.au  Fact Sheet for Healthcare Providers:  IncredibleEmployment.be  This test is no t yet approved or cleared by the [place] FDA and  has been authorized for detection and/or diagnosis of SARS-CoV-2 by FDA under an Emergency Use Authorization (EUA). This EUA will remain  in effect (meaning this test can  be used) for the duration of the COVID-19 declaration under Section 564(b)(1) of the Act, 21 U.S.C.section 360bbb-3(b)(1), unless the authorization is terminated  or revoked sooner.       Influenza A by PCR NEGATIVE NEGATIVE Final   Influenza B by PCR NEGATIVE NEGATIVE Final    Comment: (NOTE) The Xpert Xpress SARS-CoV-2/FLU/RSV plus assay is intended as an aid in the diagnosis of influenza from Nasopharyngeal swab specimens and should not be used as a sole basis for treatment. Nasal washings and aspirates are unacceptable for Xpert Xpress SARS-CoV-2/FLU/RSV testing.  Fact Sheet for Patients: EntrepreneurPulse.com.au  Fact Sheet for Healthcare Providers: IncredibleEmployment.be  This test is not yet approved or cleared by the [place] FDA and has been authorized for detection and/or diagnosis of SARS-CoV-2 by FDA under an Emergency Use Authorization (EUA). This EUA will remain in effect (meaning this test can be used) for the duration of the COVID-19 declaration under Section 564(b)(1) of the Act, 21  U.S.C. section 360bbb-3(b)(1), unless the authorization is terminated or revoked.  Performed at [facility], [address], [place], [place] [address]      Radiology Studies: MR BRAIN W WO CONTRAST  Result Date: 01/11/2021 CLINICAL DATA:  Abdominal pain, follow-up on CT findings of abdominal, syncopal episodes EXAM: MRI HEAD WITHOUT AND WITH CONTRAST TECHNIQUE: Multiplanar, multiecho pulse sequences of the brain and surrounding structures were obtained without and with intravenous contrast. CONTRAST:  65m GADAVIST GADOBUTROL 1 MMOL/ML IV SOLN COMPARISON:  None. FINDINGS: Brain: There is no acute infarction or intracranial hemorrhage. There is no intracranial mass, mass effect, or edema. There is no hydrocephalus or extra-axial fluid collection. Ventricles and sulci are within normal limits in size and configuration. Patchy foci of T2 hyperintensity in the supratentorial white matter are nonspecific but may reflect minor chronic microvascular ischemic changes. No abnormal enhancement. Vascular: Major vessel flow voids at the skull base are preserved. Skull and upper cervical spine: Normal marrow signal is preserved. Sinuses/Orbits: Minor mucosal thickening.  Orbits are unremarkable. Other: Sella is unremarkable. Fluid opacification right mastoid tip. IMPRESSION: No evidence of intracranial metastatic disease. Electronically Signed   By: PMacy MisM.D.   On: 01/11/2021 09:11   CT ABDOMEN PELVIS W CONTRAST  Result Date: 01/10/2021 CLINICAL DATA:  Acute lower abdominal pain. EXAM: CT ABDOMEN AND PELVIS WITH CONTRAST TECHNIQUE: Multidetector CT imaging of the abdomen and pelvis was performed using the standard protocol following bolus administration of intravenous contrast. CONTRAST:  106mOMNIPAQUE IOHEXOL 300 MG/ML  SOLN COMPARISON:  None. FINDINGS: Lower chest: 10 x 5 mm nodule is noted in right lower lobe which may represent intrapulmonary lymph node. Hepatobiliary: No gallstones or biliary  dilatation is noted. Multiple low densities are noted throughout hepatic parenchyma most consistent with hepatic cysts. Pancreas: Unremarkable. No pancreatic ductal dilatation or surrounding inflammatory changes. Spleen: Normal in size without focal abnormality. Adrenals/Urinary Tract: Adrenal glands appear normal. Left renal cyst is noted. No hydronephrosis or renal obstruction is noted. No renal or ureteral calculi are noted. Urinary bladder is unremarkable. Stomach/Bowel: Stomach is within normal limits. Appendix appears normal. No evidence of bowel wall thickening, distention, or inflammatory changes. Diverticulosis of descending and sigmoid colon is noted. Vascular/Lymphatic: Aortic atherosclerosis. No enlarged abdominal or pelvic lymph nodes. Reproductive: Prostate is unremarkable. Other: Mild ascites is noted in the pelvis, right pericolic gutter and around the liver. 8.1 x 7.1 cm abnormal soft tissue density is noted anteriorly in the pelvis with peripheral enhancement and surrounding ascites. This is  highly concerning for neoplasm or malignancy, potentially mesenteric in origin. Musculoskeletal: No acute or significant osseous findings. IMPRESSION: 8.1 x 7.1 cm abnormal soft tissue density is noted anteriorly in the pelvis with peripheral enhancement and surrounding ascites, and this is concerning for neoplasm or malignancy such as carcinoid or desmoid tumor. MRI with and without gadolinium is recommended for further evaluation. Diverticulosis of descending and sigmoid colon is noted without inflammation. Approximately 8 mm nodule is noted in right lower lobe which may represent intrapulmonary lymph node, but pulmonary nodule cannot be excluded. Non-contrast chest CT at 6-12 months is recommended. If the nodule is stable at time of repeat CT, then future CT at 18-24 months (from today's scan) is considered optional for low-risk patients, but is recommended for high-risk patients. This recommendation follows  the consensus statement: Guidelines for Management of Incidental Pulmonary Nodules Detected on CT Images: From the Fleischner Society 2017; Radiology 2017; 284:228-243. Aortic Atherosclerosis (ICD10-I70.0). Electronically Signed   By: Marijo Conception M.D.   On: 01/10/2021 12:33    Scheduled Meds:  enoxaparin (LOVENOX) injection  40 mg Subcutaneous Q24H   fluticasone  1 spray Each Nare Daily   Continuous Infusions:   LOS: 0 days   Time spent: 35 mins   Kassius Battiste Wynetta Emery, MD How to contact the [facility] Attending or Consulting provider [place] or covering provider during after hours [place], for this patient?  Check the care team in [facility] and look for a) attending/consulting [facility] provider listed and b) the [facility] team listed Log into [email or web address] and use [place]'s universal password to access. If you do not have the password, please contact the hospital operator. Locate the [facility] provider you are looking for under [facility] and page to a number that you can be directly reached. If you still have difficulty reaching the provider, please page the [facility] (Director on Call) for the Hospitalists listed on amion for assistance.  01/11/2021, 11:34 AM
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
## 2021-01-11 NOTE — Plan of Care (Signed)
  Problem: Education: Goal: Knowledge of General Education information will improve Description Including pain rating scale, medication(s)/side effects and non-pharmacologic comfort measures Outcome: Progressing   Problem: Health Behavior/Discharge Planning: Goal: Ability to manage health-related needs will improve Outcome: Progressing   
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
## 2021-01-12 ENCOUNTER — Inpatient Hospital Stay (HOSPITAL_COMMUNITY): Payer: Medicare Other | Admitting: Certified Registered Nurse Anesthetist

## 2021-01-12 ENCOUNTER — Encounter (HOSPITAL_COMMUNITY)
Admission: EM | Disposition: A | Discharge status: Home / Self Care | Payer: Self-pay | Source: Home / Self Care | Attending: General Surgery

## 2021-01-12 ENCOUNTER — Encounter (HOSPITAL_COMMUNITY): Payer: Self-pay | Admitting: Family Medicine

## 2021-01-12 DIAGNOSIS — R55 Syncope and collapse: Secondary | ICD-10-CM | POA: Diagnosis not present

## 2021-01-12 DIAGNOSIS — R1900 Intra-abdominal and pelvic swelling, mass and lump, unspecified site: Secondary | ICD-10-CM | POA: Diagnosis not present

## 2021-01-12 DIAGNOSIS — R911 Solitary pulmonary nodule: Secondary | ICD-10-CM | POA: Diagnosis not present

## 2021-01-12 HISTORY — PX: BOWEL RESECTION: SHX1257

## 2021-01-12 HISTORY — PX: LAPAROTOMY: SHX154

## 2021-01-12 LAB — SURGICAL PCR SCREEN
MRSA, PCR: NEGATIVE
Staphylococcus aureus: NEGATIVE

## 2021-01-12 SURGERY — LAPAROTOMY, EXPLORATORY
Anesthesia: General | Site: Abdomen

## 2021-01-12 MED ORDER — ROCURONIUM BROMIDE 10 MG/ML (PF) SYRINGE
PREFILLED_SYRINGE | INTRAVENOUS | Status: DC | PRN
  Administered 2021-01-12: 80 mg via INTRAVENOUS

## 2021-01-12 MED ORDER — PROPOFOL 10 MG/ML IV BOLUS
INTRAVENOUS | Status: DC | PRN
  Administered 2021-01-12: 40 mg via INTRAVENOUS
  Administered 2021-01-12: 10 mg via INTRAVENOUS
  Administered 2021-01-12: 60 mg via INTRAVENOUS

## 2021-01-12 MED ORDER — HYDROMORPHONE HCL 1 MG/ML IJ SOLN
0.2500 mg | INTRAMUSCULAR | Status: DC | PRN
  Administered 2021-01-12 (×3): 0.5 mg via INTRAVENOUS
  Filled 2021-01-12 (×3): qty 0.5

## 2021-01-12 MED ORDER — DEXMEDETOMIDINE (PRECEDEX) IN NS 20 MCG/5ML (4 MCG/ML) IV SYRINGE
PREFILLED_SYRINGE | INTRAVENOUS | Status: DC | PRN
  Administered 2021-01-12 (×3): 4 ug via INTRAVENOUS

## 2021-01-12 MED ORDER — EPHEDRINE 5 MG/ML INJ
INTRAVENOUS | Status: AC
  Filled 2021-01-12: qty 5

## 2021-01-12 MED ORDER — LACTATED RINGERS IV SOLN: INTRAVENOUS | Status: DC | PRN

## 2021-01-12 MED ORDER — LIDOCAINE HCL (PF) 2 % IJ SOLN
INTRAMUSCULAR | Status: AC
  Filled 2021-01-12: qty 30

## 2021-01-12 MED ORDER — SUGAMMADEX SODIUM 200 MG/2ML IV SOLN
INTRAVENOUS | Status: DC | PRN
  Administered 2021-01-12 (×4): 50 mg via INTRAVENOUS

## 2021-01-12 MED ORDER — FENTANYL CITRATE (PF) 100 MCG/2ML IJ SOLN
INTRAMUSCULAR | Status: DC | PRN
  Administered 2021-01-12 (×3): 50 ug via INTRAVENOUS
  Administered 2021-01-12: 100 ug via INTRAVENOUS

## 2021-01-12 MED ORDER — PHENYLEPHRINE HCL (PRESSORS) 10 MG/ML IV SOLN
INTRAVENOUS | Status: DC | PRN
  Administered 2021-01-12 (×2): 100 ug via INTRAVENOUS

## 2021-01-12 MED ORDER — FENTANYL CITRATE (PF) 250 MCG/5ML IJ SOLN
INTRAMUSCULAR | Status: AC
  Filled 2021-01-12: qty 5

## 2021-01-12 MED ORDER — SODIUM CHLORIDE 0.9 % IR SOLN
Status: DC | PRN
  Administered 2021-01-12: 1000 mL
  Administered 2021-01-12: 2000 mL
  Administered 2021-01-12: 3000 mL

## 2021-01-12 MED ORDER — GLYCOPYRROLATE PF 0.2 MG/ML IJ SOSY
PREFILLED_SYRINGE | INTRAMUSCULAR | Status: AC
  Filled 2021-01-12: qty 5

## 2021-01-12 MED ORDER — ONDANSETRON HCL 4 MG/2ML IJ SOLN
INTRAMUSCULAR | Status: AC
  Filled 2021-01-12: qty 8

## 2021-01-12 MED ORDER — ONDANSETRON HCL 4 MG/2ML IJ SOLN
INTRAMUSCULAR | Status: DC | PRN
  Administered 2021-01-12: 4 mg via INTRAVENOUS

## 2021-01-12 MED ORDER — BUPIVACAINE LIPOSOME 1.3 % IJ SUSP
INTRAMUSCULAR | Status: AC
  Filled 2021-01-12: qty 20

## 2021-01-12 MED ORDER — ACETAMINOPHEN 10 MG/ML IV SOLN
1000.0000 mg | Freq: Once | INTRAVENOUS | Status: AC
  Administered 2021-01-12: 1000 mg via INTRAVENOUS
  Filled 2021-01-12: qty 100

## 2021-01-12 MED ORDER — CHLORHEXIDINE GLUCONATE 0.12 % MT SOLN
OROMUCOSAL | Status: AC
  Filled 2021-01-12: qty 15

## 2021-01-12 MED ORDER — ONDANSETRON HCL 4 MG/2ML IJ SOLN: 4.0000 mg | Freq: Once | INTRAMUSCULAR | Status: DC | PRN

## 2021-01-12 MED ORDER — MORPHINE SULFATE (PF) 2 MG/ML IV SOLN
2.0000 mg | INTRAVENOUS | Status: DC | PRN
  Administered 2021-01-12 – 2021-01-14 (×4): 2 mg via INTRAVENOUS
  Filled 2021-01-12 (×4): qty 1

## 2021-01-12 MED ORDER — SUCCINYLCHOLINE CHLORIDE 200 MG/10ML IV SOSY
PREFILLED_SYRINGE | INTRAVENOUS | Status: AC
  Filled 2021-01-12: qty 10

## 2021-01-12 MED ORDER — GLYCOPYRROLATE PF 0.2 MG/ML IJ SOSY
PREFILLED_SYRINGE | INTRAMUSCULAR | Status: DC | PRN
  Administered 2021-01-12: .2 mg via INTRAVENOUS

## 2021-01-12 MED ORDER — DEXAMETHASONE SODIUM PHOSPHATE 10 MG/ML IJ SOLN
INTRAMUSCULAR | Status: AC
  Filled 2021-01-12: qty 2

## 2021-01-12 MED ORDER — LACTATED RINGERS IV SOLN
INTRAVENOUS | Status: DC
  Administered 2021-01-12: 1000 mL via INTRAVENOUS

## 2021-01-12 MED ORDER — BUPIVACAINE LIPOSOME 1.3 % IJ SUSP
INTRAMUSCULAR | Status: DC | PRN
  Administered 2021-01-12: 20 mL

## 2021-01-12 MED ORDER — ROCURONIUM BROMIDE 10 MG/ML (PF) SYRINGE
PREFILLED_SYRINGE | INTRAVENOUS | Status: AC
  Filled 2021-01-12: qty 10

## 2021-01-12 MED ORDER — LIDOCAINE HCL (CARDIAC) PF 100 MG/5ML IV SOSY
PREFILLED_SYRINGE | INTRAVENOUS | Status: DC | PRN
  Administered 2021-01-12: 80 mg via INTRAVENOUS

## 2021-01-12 MED ORDER — DEXAMETHASONE SODIUM PHOSPHATE 10 MG/ML IJ SOLN
INTRAMUSCULAR | Status: DC | PRN
  Administered 2021-01-12: 8 mg via INTRAVENOUS

## 2021-01-12 MED ORDER — DEXMEDETOMIDINE (PRECEDEX) IN NS 20 MCG/5ML (4 MCG/ML) IV SYRINGE
PREFILLED_SYRINGE | INTRAVENOUS | Status: AC
  Filled 2021-01-12: qty 10

## 2021-01-12 SURGICAL SUPPLY — 46 items
APL PRP STRL LF DISP 70% ISPRP (MISCELLANEOUS) ×2
BAG DECANTER FOR FLEXI CONT (MISCELLANEOUS) ×1 IMPLANT
CHLORAPREP W/TINT 26 (MISCELLANEOUS) ×3 IMPLANT
CLOTH BEACON ORANGE TIMEOUT ST (SAFETY) ×3 IMPLANT
COVER LIGHT HANDLE STERIS (MISCELLANEOUS) ×6 IMPLANT
DRAPE WARM FLUID 44X44 (DRAPES) ×3 IMPLANT
DRSG OPSITE POSTOP 4X8 (GAUZE/BANDAGES/DRESSINGS) ×1 IMPLANT
ELECT REM PT RETURN 9FT ADLT (ELECTROSURGICAL) ×3
ELECTRODE REM PT RTRN 9FT ADLT (ELECTROSURGICAL) ×2 IMPLANT
GLOVE SURG ENC MOIS LTX SZ6.5 (GLOVE) ×3 IMPLANT
GLOVE SURG SS PI 7.5 STRL IVOR (GLOVE) ×3 IMPLANT
GLOVE SURG UNDER POLY LF SZ6.5 (GLOVE) ×3 IMPLANT
GLOVE SURG UNDER POLY LF SZ7 (GLOVE) ×6 IMPLANT
GOWN STRL REUS W/TWL LRG LVL3 (GOWN DISPOSABLE) ×9 IMPLANT
HANDLE SUCTION POOLE (INSTRUMENTS) ×2 IMPLANT
INST SET MAJOR GENERAL (KITS) ×3 IMPLANT
IV NS IRRIG 3000ML ARTHROMATIC (IV SOLUTION) ×1 IMPLANT
KIT TURNOVER KIT A (KITS) ×3 IMPLANT
LIGASURE IMPACT 36 18CM CVD LR (INSTRUMENTS) ×1 IMPLANT
MANIFOLD NEPTUNE II (INSTRUMENTS) ×3 IMPLANT
NDL HYPO 18GX1.5 BLUNT FILL (NEEDLE) ×2 IMPLANT
NDL HYPO 21X1.5 SAFETY (NEEDLE) ×2 IMPLANT
NEEDLE HYPO 18GX1.5 BLUNT FILL (NEEDLE) ×3 IMPLANT
NEEDLE HYPO 21X1.5 SAFETY (NEEDLE) ×3 IMPLANT
NS IRRIG 1000ML POUR BTL (IV SOLUTION) ×7 IMPLANT
PACK MAJOR ABDOMINAL (CUSTOM PROCEDURE TRAY) ×3 IMPLANT
PAD ARMBOARD 7.5X6 YLW CONV (MISCELLANEOUS) ×3 IMPLANT
PENCIL SMOKE EVACUATOR COATED (MISCELLANEOUS) ×3 IMPLANT
RELOAD PROXIMATE 75MM BLUE (ENDOMECHANICALS) ×6 IMPLANT
RELOAD STAPLE 75 3.8 BLU REG (ENDOMECHANICALS) IMPLANT
RETRACTOR WOUND ALXS 18CM MED (MISCELLANEOUS) IMPLANT
RTRCTR WOUND ALEXIS O 18CM MED (MISCELLANEOUS) ×3
SET BASIN LINEN APH (SET/KITS/TRAYS/PACK) ×3 IMPLANT
SPONGE T-LAP 18X18 ~~LOC~~+RFID (SPONGE) ×4 IMPLANT
STAPLER GUN LINEAR PROX 60 (STAPLE) ×1 IMPLANT
STAPLER PROXIMATE 75MM BLUE (STAPLE) ×1 IMPLANT
STAPLER VISISTAT (STAPLE) ×3 IMPLANT
SUCTION POOLE HANDLE (INSTRUMENTS) ×3
SUT CHROMIC 0 SH (SUTURE) IMPLANT
SUT CHROMIC 2 0 SH (SUTURE) ×1 IMPLANT
SUT CHROMIC 3 0 SH 27 (SUTURE) IMPLANT
SUT PDS AB CT VIOLET #0 27IN (SUTURE) ×6 IMPLANT
SUT PROLENE 2 0 SH 30 (SUTURE) IMPLANT
SUT SILK 3 0 SH CR/8 (SUTURE) ×4 IMPLANT
SYR 20ML LL LF (SYRINGE) ×6 IMPLANT
TRAY FOLEY MTR SLVR 16FR STAT (SET/KITS/TRAYS/PACK) ×3 IMPLANT

## 2021-01-12 NOTE — Anesthesia Procedure Notes (Signed)
Procedure Name: Intubation Date/Time: 01/12/2021 12:58 PM Performed by: Gayland Curry, CRNA Pre-anesthesia Checklist: Patient identified, Emergency Drugs available, Suction available and Patient being monitored Patient Re-evaluated:Patient Re-evaluated prior to induction Oxygen Delivery Method: Circle system utilized Preoxygenation: Pre-oxygenation with 100% oxygen Induction Type: IV induction Ventilation: Mask ventilation without difficulty and Oral airway inserted - appropriate to patient size Laryngoscope Size: Mac and 4 (Mac 3 light not working, moved to next blade) Grade View: Grade I Tube type: Oral Tube size: 7.0 mm Number of attempts: 1 Placement Confirmation: ETT inserted through vocal cords under direct vision, positive ETCO2 and breath sounds checked- equal and bilateral Secured at: 23 cm Tube secured with: Tape Dental Injury: Teeth and Oropharynx as per pre-operative assessment

## 2021-01-12 NOTE — Interval H&P Note (Signed)
History and Physical Interval Note:  01/12/2021 12:18 PM  Ross Duke  has presented today for surgery, with the diagnosis of abdominal mass, concern for cancer.  The various methods of treatment have been discussed with the patient and family. After consideration of risks, benefits and other options for treatment, the patient has consented to  Procedure(s): EXPLORATORY LAPAROTOMY, excision of mass, possible bowel resection (N/A) as a surgical intervention.  The patient's history has been reviewed, patient examined, no change in status, stable for surgery.  I have reviewed the patient's chart and labs.  Questions were answered to the patient's satisfaction.     Virl Cagey

## 2021-01-12 NOTE — Progress Notes (Signed)
[facility]  Small bowel mass s/p resection. NPO with NG and can have some oral meds with suction held for 30 minutes. Binder. PRN morphine for pain.  Updated wife and team.  Curlene Labrum, MD [facility] [address] [place], [place] [address] [phone] (office)

## 2021-01-12 NOTE — Anesthesia Postprocedure Evaluation (Signed)
Anesthesia Post Note  Patient: Ross Duke  Procedure(s) Performed: EXPLORATORY LAPAROTOMY, EXCISION OF INTRA ABDOMINAL MASS SMALL BOWEL RESECTION (Abdomen)  Patient location during evaluation: Phase II Anesthesia Type: General Level of consciousness: awake Pain management: pain level controlled Vital Signs Assessment: post-procedure vital signs reviewed and stable Respiratory status: spontaneous breathing and respiratory function stable Cardiovascular status: blood pressure returned to baseline and stable Postop Assessment: no headache and no apparent nausea or vomiting Anesthetic complications: no Comments: Late entry   No notable events documented.   Last Vitals:  Vitals:   01/12/21 1500 01/12/21 1515  BP: 130/90 120/90  Pulse: 92 86  Resp: 19 13  Temp:    SpO2: 98% 98%    Last Pain:  Vitals:   01/12/21 1512  TempSrc:   PainSc: [place]

## 2021-01-12 NOTE — Anesthesia Preprocedure Evaluation (Signed)
Anesthesia Evaluation  Patient identified by MRN, date of birth, ID band Patient awake    Reviewed: Allergy & Precautions, H&P , NPO status , Patient's Chart, lab work & pertinent test results, reviewed documented beta blocker date and time   Airway Mallampati: II  TM Distance: >3 FB Neck ROM: full    Dental no notable dental hx.    Pulmonary neg pulmonary ROS, former smoker,    Pulmonary exam normal breath sounds clear to auscultation       Cardiovascular Exercise Tolerance: Good negative cardio ROS   Rhythm:regular Rate:Normal     Neuro/Psych negative neurological ROS  negative psych ROS   GI/Hepatic negative GI ROS, Neg liver ROS,   Endo/Other  negative endocrine ROS  Renal/GU negative Renal ROS  negative genitourinary   Musculoskeletal   Abdominal   Peds  Hematology negative hematology ROS (+)   Anesthesia Other Findings   Reproductive/Obstetrics negative OB ROS                             Anesthesia Physical Anesthesia Plan  ASA: 2  Anesthesia Plan: General   Post-op Pain Management:    Induction:   PONV Risk Score and Plan: Propofol infusion  Airway Management Planned:   Additional Equipment:   Intra-op Plan:   Post-operative Plan:   Informed Consent: I have reviewed the patients History and Physical, chart, labs and discussed the procedure including the risks, benefits and alternatives for the proposed anesthesia with the patient or authorized representative who has indicated his/her understanding and acceptance.     Dental Advisory Given  Plan Discussed with: CRNA  Anesthesia Plan Comments:         Anesthesia Quick Evaluation  

## 2021-01-12 NOTE — Op Note (Signed)
[facility] Operative Note  01/12/21  Preoperative Diagnosis:  Intraabdominal mass    Postoperative Diagnosis: Small bowel mass with hemoperitoneum    Procedure(s) Performed: Small bowel resection with resection of mass    Surgeon: Lanell Matar. Constance Haw, MD   Assistants: No qualified resident was available    Anesthesia: General endotracheal   Anesthesiologist: Ross Sjogren, MD    Specimens: Small bowel with mass    Estimated Blood Loss: Minimal   Blood Replacement: None    Complications: None   Wound Class: Clean contaminated   Operative Indications: Ross Duke is a 70 yo with a intraabdominal mass that was noted on CT after coming in with abdominal pain. It was unclear what the etiology of this mass was and if it was associated with any small intestine. We discussed excision and risk of bleeding, infection, anastomosis and leak, and risk of finding something like cancer and needing additional treatment   Findings:Large mass 8+ cm with hemorrhage on the mid jejunum, hemoperitoneum       Procedure: The patient was taken to the operating room and placed supine. General endotracheal anesthesia was induced. Intravenous antibiotics were administered per protocol.  An orogastric tube positioned to decompress the stomach.  A foley catheter was placed. The abdomen was prepared and draped in the usual sterile fashion.   A lower midline was made and carried down through to the fascia with cautery. Care was take on entry into the abdomen. Hemoperitoneum was noted but no ascites type fluid. This was evacuated along with clots. A wound protector was placed.  The mass was eviscerated out of the right lower quadrant and was clearly adherent/ growing off of the small bowel. The bowel was not currently obstructed.  The small bowel was ran proximally and distally and this was in the mid jejunum. The remaining small bowel was healthy. The colon was inspected and no masses were  felt, the liver was palpated and no masses were felt and the peritoneum was palpated and no sudding was noted.    A small bowel resection was performed with two linear 75 mm cutting staples taking 10cm on either side of the mass and a significant wedge of mesentery inferior to the small bowel was taken with a Ligasure. The proximal bowel was marked with a silk suture.  The specimen was sent to pathology with rush orders.    The small bowel anastomosis was done in the standard fashion in the antimesenteric portion of the bowel using a linear cutting 75 mm stapler after two enterotomies were made. The common enterotomy was closed with a TA 60 staple. Two crotch sutures were placed with 3-0 Silk interrupted, and the staple line was oversewn with a 3-0 Lembert Silk suture. The mesentery was closed with a 2-0 Chromic gut.  An NG was confirmed in the stomach. Irrigation was performed and the hemoperitoneum was evacuated. No additional bleeding was noted. The abdomen was then closed in the standard fashion using 0 PDS sutures and Exparel was injected. The skin was closed with staples. A sterile honeycomb dressing was placed.  Final inspection revealed acceptable hemostasis. All counts were correct at the end of the case. The patient was awakened from anesthesia and extubated without complication.  The patient went to the PACU in stable condition.   Curlene Labrum, MD [facility] [address] [place], [place] [address] [phone] (office)

## 2021-01-12 NOTE — Transfer of Care (Signed)
Immediate Anesthesia Transfer of Care Note  Patient: Ross Duke  Procedure(s) Performed: EXPLORATORY LAPAROTOMY, EXCISION OF INTRA ABDOMINAL MASS SMALL BOWEL RESECTION (Abdomen)  Patient Location: PACU  Anesthesia Type:General  Level of Consciousness: awake, alert , oriented and patient cooperative  Airway & Oxygen Therapy: Patient Spontanous Breathing and Patient connected to nasal cannula oxygen  Post-op Assessment: Report given to RN  Post vital signs: Reviewed and stable  Last Vitals:  Vitals Value Taken Time  BP 152/86 01/12/21 1430  Temp    Pulse 90 01/12/21 1430  Resp 13 01/12/21 1430  SpO2 100 % 01/12/21 1430  Vitals shown include unvalidated device data.  Last Pain:  Vitals:   01/12/21 1109  TempSrc:   PainSc: 0-No pain         Complications: No notable events documented.

## 2021-01-12 NOTE — Progress Notes (Signed)
PROGRESS NOTE   Ross Duke  [id number] DOB: [birth year] DOA: 01/10/2021 PCP: System, Provider Not In   Chief Complaint  Patient presents with   Abdominal Pain   Level of care: Telemetry  Brief Admission History:  70 y.o. male with no significant medical history significant who presented to the ED with complaints of abdominal pain and 3 episodes of passing out.  Patient reports onset of abdominal pain today, and 3 episodes of soft slightly loose stools.  He denies diarrhea, stools were not watery.  Over the past few months patient has had intermittent abdominal pain mostly left-sided, and lower abdomen, but today he had it on the right side.  He reports episodes of spontaneous flushing, and feeling hot over the past few months.  Had an episode of diarrhea last EGD about a month ago.  Recent prescription of amoxicillin for sinus infection about a month ago.   Today patient had 3 episodes of passing out, spouse was present each time, she reports patient was about to vomit-of which he had 3 episodes (of vomiting ) prior to arrival.  The first time patient was upright, bent over the commode, when he almost passed out, with his eyes rolling upward, spouse assisted him but he did not fall.  The first time, patient was shaking all over, but not jerking.  Patient was became diaphoretic the 3 times he passed out.  Patient was out for a few seconds at a time and quickly recovered.   No previous episodes.  No chest pain, no difficulty breathing.  Patient has kept up with his colonoscopies as recommended.  Per spouse, patient quit smoking cigarettes at least 15 years ago.   ED Course: Temperature 97.4.  Heart rate 60s.  Blood pressure 111-136.  WBC 15.3.  Abdominal CT shows 8.1 x 7.1 cm abnormal soft tissue mass anteriorly in the pelvis, with surrounding ascites concerning for neoplasm or malignancy such as carcinoid or desmoid tumor.  MRI recommended.  500 mill bolus given.  Hospitalist to  admit.  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  MRI team discussed with abdomen specialist Dr. Kris Hartmann at [facility] and his advice was that MRI abdomen was not needed.  I shared his recommendation with surgery.  Dr. Constance Haw consulted and planning for exploratory abdominal surgery on 7/26. MRI brain did NOT show evidence of intracranial metastatic disease.  Further recommendations to follow.    Syncope - I suspect vasovagal syncope given he was actively vomiting at the time.    Constipation - laxatives ordered as needed.    Anemia unspecified - Hg down to 10.0. Following CBC daily.   Pulmonary nodule - Outpatient follow up will be needed.  DVT prophylaxis: enoxaparin  Code Status: full  Family Communication: wife updated by telephone 7/25 Disposition: anticipating home  Status is: Inpatient   The patient will require care spanning > 2 midnights and should be moved to inpatient because: IV treatments appropriate due to intensity of illness or inability to take PO and Inpatient level of care appropriate due to severity of illness  Dispo: The patient is from: Home              Anticipated d/c is to: Home              Patient currently is not medically stable to d/c.  Difficult to place patient No  Consultants:  Surgery   Procedures:    Antimicrobials:  N/a    Subjective: Pt a little anxious about surgery today, abdominal pain unchanged.  He is NPO now.    Objective: Vitals:   01/11/21 0209 01/11/21 0614 01/11/21 1311 01/11/21 2043  BP: 118/75 130/79 120/80 (!) 105/41  Pulse: 86 80 (!) 41 86  Resp: '18 18 18 19  '$ Temp: 98.9 F (37.2 C) 98.2 F (36.8 C) 98.3 F (36.8 C) 98.9 F (37.2 C)  TempSrc:   Oral Oral  SpO2: 100% 96% 97% 99%  Weight:      Height:        Intake/Output Summary (Last 24 hours) at 01/12/2021  1236 Last data filed at 01/12/2021 0900 Gross per 24 hour  Intake 0 ml  Output --  Net 0 ml   Filed Weights   01/10/21 1056  Weight: 77.1 kg    Examination:  General exam: Appears calm and comfortable  Respiratory system: Clear to auscultation. Respiratory effort normal. Cardiovascular system: normal S1 & S2 heard. No JVD, murmurs, rubs, gallops or clicks. No pedal edema. Gastrointestinal system: Abdomen is nondistended, soft and mild periumbilical tenderness with light palpation. No organomegaly or masses felt. Normal bowel sounds heard. Central nervous system: Alert and oriented. No focal neurological deficits. Extremities: Symmetric 5 x 5 power. Skin: No rashes, lesions or ulcers Psychiatry: Judgement and insight appear normal. Mood & affect appropriate.   Data Reviewed: I have personally reviewed following labs and imaging studies  CBC: Recent Labs  Lab 01/10/21 1130 01/11/21 0453  WBC 15.3* 10.4  NEUTROABS 11.5*  --   HGB 12.0* 10.0*  HCT 38.8* 32.0*  MCV 72.5* 72.1*  PLT 268 [id number]    Basic Metabolic Panel: Recent Labs  Lab 01/10/21 1130 01/11/21 0453  NA 136 136  K 4.4 3.7  CL 111 107  CO2 19* 23  GLUCOSE 158* 97  BUN 16 14  CREATININE 1.25* 1.19  CALCIUM 8.4* 8.2*    GFR: Estimated Creatinine Clearance: 55.6 mL/min (by C-G formula based on SCr of 1.19 mg/dL).  Liver Function Tests: Recent Labs  Lab 01/10/21 1130  AST 35  ALT 16  ALKPHOS 51  BILITOT 0.7  PROT 6.2*  ALBUMIN 3.5    CBG: No results for input(s): GLUCAP in the last 168 hours.  Recent Results (from the past 240 hour(s))  Resp Panel by RT-PCR (Flu A&B, Covid) Nasopharyngeal Swab     Status: None   Collection Time: 01/10/21  3:49 PM   Specimen: Nasopharyngeal Swab; Nasopharyngeal(NP) swabs in vial transport medium  Result Value Ref Range Status   SARS Coronavirus 2 by RT PCR NEGATIVE NEGATIVE Final    Comment: (NOTE) SARS-CoV-2 target nucleic acids are NOT DETECTED.  The  SARS-CoV-2 RNA is generally detectable in upper respiratory specimens during the acute phase of infection. The lowest concentration of SARS-CoV-2 viral copies this assay can detect is 138 copies/mL. A negative result does not preclude SARS-Cov-2 infection and should not be used as the sole basis for treatment or other patient management decisions. A negative result may occur with  improper specimen collection/handling, submission of specimen other than nasopharyngeal swab, presence of viral mutation(s) within the areas targeted by this assay, and inadequate number of viral copies(<138 copies/mL). A negative result must be combined with clinical observations, patient history, and epidemiological information. The expected result is Negative.  Fact Sheet for Patients:  EntrepreneurPulse.com.au  Fact Sheet for Healthcare Providers:  IncredibleEmployment.be  This test is no t yet approved or cleared by the [place] and  has been authorized for detection and/or diagnosis of SARS-CoV-2 by FDA under an Emergency Use Authorization (EUA). This EUA will remain  in effect (meaning this test can be used) for the duration of the COVID-19 declaration under Section 564(b)(1) of the Act, 21 U.S.C.section 360bbb-3(b)(1), unless the authorization is terminated  or revoked sooner.       Influenza A by PCR NEGATIVE NEGATIVE Final   Influenza B by PCR NEGATIVE NEGATIVE Final    Comment: (NOTE) The Xpert Xpress SARS-CoV-2/FLU/RSV plus assay is intended as an aid in the diagnosis of influenza from Nasopharyngeal swab specimens and should not be used as a sole basis for treatment. Nasal washings and aspirates are unacceptable for Xpert Xpress SARS-CoV-2/FLU/RSV testing.  Fact Sheet for Patients: EntrepreneurPulse.com.au  Fact Sheet for Healthcare Providers: IncredibleEmployment.be  This test is not yet approved or  cleared by the [place] FDA and has been authorized for detection and/or diagnosis of SARS-CoV-2 by FDA under an Emergency Use Authorization (EUA). This EUA will remain in effect (meaning this test can be used) for the duration of the COVID-19 declaration under Section 564(b)(1) of the Act, 21 U.S.C. section 360bbb-3(b)(1), unless the authorization is terminated or revoked.  Performed at [facility], [address], [place], [place] [address]   Surgical pcr screen     Status: None   Collection Time: 01/12/21  8:00 AM   Specimen: Nasal Mucosa; Nasal Swab  Result Value Ref Range Status   MRSA, PCR NEGATIVE NEGATIVE Final   Staphylococcus aureus NEGATIVE NEGATIVE Final    Comment: (NOTE) The Xpert SA Assay (FDA approved for NASAL specimens in patients 75 years of age and older), is one component of a comprehensive surveillance program. It is not intended to diagnose infection nor to guide or monitor treatment. Performed at [facility], [address], [place], [place] [address]      Radiology Studies: MR BRAIN W WO CONTRAST  Result Date: 01/11/2021 CLINICAL DATA:  Abdominal pain, follow-up on CT findings of abdominal, syncopal episodes EXAM: MRI HEAD WITHOUT AND WITH CONTRAST TECHNIQUE: Multiplanar, multiecho pulse sequences of the brain and surrounding structures were obtained without and with intravenous contrast. CONTRAST:  71m GADAVIST GADOBUTROL 1 MMOL/ML IV SOLN COMPARISON:  None. FINDINGS: Brain: There is no acute infarction or intracranial hemorrhage. There is no intracranial mass, mass effect, or edema. There is no hydrocephalus or extra-axial fluid collection. Ventricles and sulci are within normal limits in size and configuration. Patchy foci of T2 hyperintensity in the supratentorial white matter are nonspecific but may reflect minor chronic microvascular ischemic changes. No abnormal enhancement. Vascular: Major vessel flow voids at the skull base are preserved. Skull and  upper cervical spine: Normal marrow signal is preserved. Sinuses/Orbits: Minor mucosal thickening.  Orbits are unremarkable. Other: Sella is unremarkable. Fluid opacification right mastoid tip. IMPRESSION: No evidence of intracranial metastatic disease. Electronically Signed   By: PMacy MisM.D.   On: 01/11/2021 09:11   ECHOCARDIOGRAM COMPLETE  Result Date: 01/11/2021    ECHOCARDIOGRAM REPORT   Patient Name:   Ross CALFEEDate of Exam: 01/11/2021 Medical Rec #:  [id number]     Height:       66.0 in Accession #:    [id number]    Weight:       170.0 lb Date of Birth:  [birth year]      BSA:  1.866 m Patient Age:    46 years       BP:           130/79 mmHg Patient Gender: M              HR:           80 bpm. Exam Location:  [facility] Procedure: 2D Echo, Cardiac Doppler and Color Doppler Indications:    Syncope  History:        Patient has no prior history of Echocardiogram examinations.                 Arrythmias:LBBB; Signs/Symptoms:Syncope.  Sonographer:    Wenda Low Referring Phys: [place]  1. Left ventricular ejection fraction, by estimation, is 65 to 70%. The left ventricle has normal function. The left ventricle has no regional wall motion abnormalities. There is mild left ventricular hypertrophy. Left ventricular diastolic parameters were normal.  2. Right ventricular systolic function is normal. The right ventricular size is normal.  3. The mitral valve is normal in structure. Trivial mitral valve regurgitation.  4. The aortic valve is tricuspid. Aortic valve regurgitation is not visualized. Mild to moderate aortic valve sclerosis/calcification is present, without any evidence of aortic stenosis.  5. The inferior vena cava is normal in size with greater than 50% respiratory variability, suggesting right atrial pressure of 3 mmHg. FINDINGS  Left Ventricle: Left ventricular ejection fraction, by estimation, is 65 to 70%. The left ventricle has normal  function. The left ventricle has no regional wall motion abnormalities. The left ventricular internal cavity size was normal in size. There is  mild left ventricular hypertrophy. Left ventricular diastolic parameters were normal. Right Ventricle: The right ventricular size is normal. Right vetricular wall thickness was not assessed. Right ventricular systolic function is normal. Left Atrium: Left atrial size was normal in size. Right Atrium: Right atrial size was normal in size. Pericardium: Trivial pericardial effusion is present. Mitral Valve: The mitral valve is normal in structure. Trivial mitral valve regurgitation. MV peak gradient, 2.7 mmHg. The mean mitral valve gradient is 1.0 mmHg. Tricuspid Valve: The tricuspid valve is normal in structure. Tricuspid valve regurgitation is trivial. Aortic Valve: The aortic valve is tricuspid. Aortic valve regurgitation is not visualized. Mild to moderate aortic valve sclerosis/calcification is present, without any evidence of aortic stenosis. Aortic valve mean gradient measures 4.0 mmHg. Aortic valve peak gradient measures 6.4 mmHg. Aortic valve area, by VTI measures 3.06 cm. Pulmonic Valve: The pulmonic valve was normal in structure. Pulmonic valve regurgitation is not visualized. Aorta: The aortic root and ascending aorta are structurally normal, with no evidence of dilitation. Venous: The inferior vena cava is normal in size with greater than 50% respiratory variability, suggesting right atrial pressure of 3 mmHg. IAS/Shunts: No atrial level shunt detected by color flow Doppler.  LEFT VENTRICLE PLAX 2D LVIDd:         4.19 cm  Diastology LVIDs:         2.53 cm  LV e' medial:    7.10 cm/s LV PW:         1.31 cm  LV E/e' medial:  10.4 LV IVS:        1.30 cm  LV e' lateral:   8.50 cm/s LVOT diam:     2.10 cm  LV E/e' lateral: 8.7 LV SV:         87 LV SV Index:   46 LVOT Area:  3.46 cm  RIGHT VENTRICLE RV Basal diam:  3.20 cm RV Mid diam:    2.79 cm RV S prime:      16.20 cm/s TAPSE (M-mode): 2.0 cm LEFT ATRIUM             Index       RIGHT ATRIUM           Index LA diam:        3.60 cm 1.93 cm/m  RA Area:     16.00 cm LA Vol (A2C):   39.7 ml 21.27 ml/m RA Volume:   40.70 ml  21.81 ml/m LA Vol (A4C):   35.0 ml 18.75 ml/m LA Biplane Vol: 40.0 ml 21.43 ml/m  AORTIC VALVE AV Area (Vmax):    3.38 cm AV Area (Vmean):   3.05 cm AV Area (VTI):     3.06 cm AV Vmax:           126.00 cm/s AV Vmean:          101.000 cm/s AV VTI:            0.283 m AV Peak Grad:      6.4 mmHg AV Mean Grad:      4.0 mmHg LVOT Vmax:         123.00 cm/s LVOT Vmean:        88.900 cm/s LVOT VTI:          0.250 m LVOT/AV VTI ratio: 0.88  AORTA Ao Root diam: 3.90 cm Ao Asc diam:  3.60 cm MITRAL VALVE MV Area (PHT): 2.81 cm    SHUNTS MV Area VTI:   3.59 cm    Systemic VTI:  0.25 m MV Peak grad:  2.7 mmHg    Systemic Diam: 2.10 cm MV Mean grad:  1.0 mmHg MV Vmax:       0.82 m/s MV Vmean:      49.4 cm/s MV Decel Time: 270 msec MV E velocity: 74.10 cm/s MV A velocity: 68.60 cm/s MV E/A ratio:  1.08 Dorris Carnes MD Electronically signed by Dorris Carnes MD Signature Date/Time: 01/11/2021/4:10:36 PM    Final     Scheduled Meds:  acetaminophen  1,000 mg Oral On Call to OR   chlorhexidine       [MAR Hold] enoxaparin (LOVENOX) injection  40 mg Subcutaneous Q24H   [MAR Hold] fluticasone  1 spray Each Nare Daily   Continuous Infusions:  cefoTEtan (CEFOTAN) IV       LOS: 1 day   Time spent: 35 mins   Iktan Aikman Wynetta Emery, MD How to contact the [facility] Attending or Consulting provider 7A - 7P or covering provider during after hours 7P -7A, for this patient?  Check the care team in [facility] and look for a) attending/consulting [facility] provider listed and b) the [facility] team listed Log into [email or web address] and use [place]'s universal password to access. If you do not have the password, please contact the hospital operator. Locate the [facility] provider you are looking for under [facility] and page to a number that you  can be directly reached. If you still have difficulty reaching the provider, please page the [facility] (Director on Call) for the Hospitalists listed on amion for assistance.  01/12/2021, 12:36 PM

## 2021-01-13 ENCOUNTER — Encounter (HOSPITAL_COMMUNITY): Payer: Self-pay | Admitting: General Surgery

## 2021-01-13 LAB — CBC WITH DIFFERENTIAL/PLATELET
Abs Immature Granulocytes: 0.06 10*3/uL (ref 0.00–0.07)
Basophils Absolute: 0 10*3/uL (ref 0.0–0.1)
Basophils Relative: 0 %
Eosinophils Absolute: 0 10*3/uL (ref 0.0–0.5)
Eosinophils Relative: 0 %
HCT: 30.8 % — ABNORMAL LOW (ref 39.0–52.0)
Hemoglobin: 9.6 g/dL — ABNORMAL LOW (ref 13.0–17.0)
Immature Granulocytes: 1 %
Lymphocytes Relative: 12 %
Lymphs Abs: 1.4 10*3/uL (ref 0.7–4.0)
MCH: 23 pg — ABNORMAL LOW (ref 26.0–34.0)
MCHC: 31.2 g/dL (ref 30.0–36.0)
MCV: 73.7 fL — ABNORMAL LOW (ref 80.0–100.0)
Monocytes Absolute: 0.9 10*3/uL (ref 0.1–1.0)
Monocytes Relative: 8 %
Neutro Abs: 9.4 10*3/uL — ABNORMAL HIGH (ref 1.7–7.7)
Neutrophils Relative %: 79 %
Platelets: 262 10*3/uL (ref 150–400)
RBC: 4.18 MIL/uL — ABNORMAL LOW (ref 4.22–5.81)
RDW: 14.6 % (ref 11.5–15.5)
WBC: 11.7 10*3/uL — ABNORMAL HIGH (ref 4.0–10.5)
nRBC: 0 % (ref 0.0–0.2)

## 2021-01-13 LAB — COMPREHENSIVE METABOLIC PANEL
ALT: 17 U/L (ref 0–44)
AST: 33 U/L (ref 15–41)
Albumin: 3.3 g/dL — ABNORMAL LOW (ref 3.5–5.0)
Alkaline Phosphatase: 34 U/L — ABNORMAL LOW (ref 38–126)
Anion gap: 9 (ref 5–15)
BUN: 21 mg/dL (ref 8–23)
CO2: 24 mmol/L (ref 22–32)
Calcium: 8.7 mg/dL — ABNORMAL LOW (ref 8.9–10.3)
Chloride: 106 mmol/L (ref 98–111)
Creatinine, Ser: 1.27 mg/dL — ABNORMAL HIGH (ref 0.61–1.24)
GFR, Estimated: >60 mL/min (ref >60)
Glucose, Bld: 112 mg/dL — ABNORMAL HIGH (ref 70–99)
Potassium: 4.4 mmol/L (ref 3.5–5.1)
Sodium: 139 mmol/L (ref 135–145)
Total Bilirubin: 0.7 mg/dL (ref 0.3–1.2)
Total Protein: 6.4 g/dL — ABNORMAL LOW (ref 6.5–8.1)

## 2021-01-13 MED ORDER — PANTOPRAZOLE SODIUM 40 MG IV SOLR
40.0000 mg | Freq: Two times a day (BID) | INTRAVENOUS | Status: DC
  Administered 2021-01-13 – 2021-01-15 (×4): 40 mg via INTRAVENOUS
  Filled 2021-01-13 (×3): qty 40

## 2021-01-13 MED ORDER — PHENOL 1.4 % MT LIQD
1.0000 | OROMUCOSAL | Status: DC | PRN
  Administered 2021-01-13: 1 via OROMUCOSAL
  Filled 2021-01-13: qty 177

## 2021-01-13 MED ORDER — PANTOPRAZOLE SODIUM 40 MG IV SOLR: 40.0000 mg | Freq: Two times a day (BID) | INTRAVENOUS | Status: DC

## 2021-01-13 MED ORDER — PANTOPRAZOLE SODIUM 40 MG IV SOLR
40.0000 mg | Freq: Two times a day (BID) | INTRAVENOUS | Status: DC
  Filled 2021-01-13: qty 40

## 2021-01-13 MED ORDER — SIMETHICONE 80 MG PO CHEW
80.0000 mg | CHEWABLE_TABLET | Freq: Four times a day (QID) | ORAL | Status: DC | PRN
  Administered 2021-01-13 – 2021-01-16 (×3): 80 mg via ORAL
  Filled 2021-01-13 (×3): qty 1

## 2021-01-13 NOTE — Plan of Care (Signed)

## 2021-01-13 NOTE — Progress Notes (Signed)
[facility] Progress Note  1 Day Post-Op  Subjective: Ng irritating him. Some hiccups and burping, no flatus or Bms. Feels like he could pass some gas.   Objective: Vital signs in last 24 hours: Temp:  [97.7 F (36.5 C)-98.2 F (36.8 C)] 98.1 F (36.7 C) (07/27 1109) Pulse Rate:  [73-90] 79 (07/27 1109) Resp:  [17-19] 17 (07/27 1109) BP: (122-135)/(79-83) 135/82 (07/27 1109) SpO2:  [97 %-98 %] 98 % (07/27 1109) Last BM Date: 01/10/21  Intake/Output from previous day: 07/26 0701 - 07/27 0700 In: 1470.8 [I.V.:1270.8; IV Piggyback:200] Out: 105 [Urine:80; Blood:25] Intake/Output this shift: Total I/O In: 0  Out: 500 [Urine:500]  General appearance: alert, cooperative, and no distress Resp: normal work of breathing GI: soft, nondistended, appropriately tender, staples c/d/I under honeycomb, no drainage or erythema  Lab Results:  Recent Labs    01/11/21 0453 01/13/21 0502  WBC 10.4 11.7*  HGB 10.0* 9.6*  HCT 32.0* 30.8*  PLT 244 262   BMET Recent Labs    01/11/21 0453 01/13/21 0502  NA 136 139  K 3.7 4.4  CL 107 106  CO2 23 24  GLUCOSE 97 112*  BUN 14 21  CREATININE 1.19 1.27*  CALCIUM 8.2* 8.7*   PT/INR No results for input(s): LABPROT, INR in the last 72 hours.  Studies/Results: No results found.  Anti-infectives: Anti-infectives (From admission, onward)    Start     Dose/Rate Route Frequency Ordered Stop   01/12/21 0600  cefoTEtan (CEFOTAN) 2 g in sodium chloride 0.9 % 100 mL IVPB        2 g 200 mL/hr over 30 Minutes Intravenous On call to O.R. 01/11/21 1827 01/12/21 1305       Assessment/Plan: [name] is a 71 yo s/p SBR for mass of unknown etiology. Awaiting [facility] pathology.  PRN for pain IS, OOB Ambulate off suction Off tele NPO and sips and chips ok Chloraseptic for NG, if NG falls out leave out Protonix Bid and gasx ordered LR @ 75 H&H drifting but stabilizing, hemoperitoneum evacuated from where mass bled Labs  in AM SCDs, holding prophylaxis for DVT with drifting H&H    LOS: 2 days    Virl Cagey 01/13/2021

## 2021-01-14 LAB — CBC WITH DIFFERENTIAL/PLATELET
Abs Immature Granulocytes: 0.05 10*3/uL (ref 0.00–0.07)
Basophils Absolute: 0.1 10*3/uL (ref 0.0–0.1)
Basophils Relative: 1 %
Eosinophils Absolute: 0.1 10*3/uL (ref 0.0–0.5)
Eosinophils Relative: 1 %
HCT: 32.8 % — ABNORMAL LOW (ref 39.0–52.0)
Hemoglobin: 10 g/dL — ABNORMAL LOW (ref 13.0–17.0)
Immature Granulocytes: 0 %
Lymphocytes Relative: 28 %
Lymphs Abs: 3.4 10*3/uL (ref 0.7–4.0)
MCH: 22.2 pg — ABNORMAL LOW (ref 26.0–34.0)
MCHC: 30.5 g/dL (ref 30.0–36.0)
MCV: 72.7 fL — ABNORMAL LOW (ref 80.0–100.0)
Monocytes Absolute: 1 10*3/uL (ref 0.1–1.0)
Monocytes Relative: 8 %
Neutro Abs: 7.4 10*3/uL (ref 1.7–7.7)
Neutrophils Relative %: 62 %
Platelets: 285 10*3/uL (ref 150–400)
RBC: 4.51 MIL/uL (ref 4.22–5.81)
RDW: 14.4 % (ref 11.5–15.5)
WBC: 12 10*3/uL — ABNORMAL HIGH (ref 4.0–10.5)
nRBC: 0 % (ref 0.0–0.2)

## 2021-01-14 LAB — BASIC METABOLIC PANEL
Anion gap: 10 (ref 5–15)
BUN: 21 mg/dL (ref 8–23)
CO2: 26 mmol/L (ref 22–32)
Calcium: 9 mg/dL (ref 8.9–10.3)
Chloride: 104 mmol/L (ref 98–111)
Creatinine, Ser: 1.23 mg/dL (ref 0.61–1.24)
GFR, Estimated: >60 mL/min (ref >60)
Glucose, Bld: 82 mg/dL (ref 70–99)
Potassium: 3.5 mmol/L (ref 3.5–5.1)
Sodium: 140 mmol/L (ref 135–145)

## 2021-01-14 MED ORDER — DOCUSATE SODIUM 100 MG PO CAPS
100.0000 mg | ORAL_CAPSULE | Freq: Two times a day (BID) | ORAL | Status: DC
  Administered 2021-01-14 – 2021-01-18 (×9): 100 mg via ORAL
  Filled 2021-01-14 (×9): qty 1

## 2021-01-14 MED ORDER — HEPARIN SODIUM (PORCINE) 5000 UNIT/ML IJ SOLN
5000.0000 [IU] | Freq: Three times a day (TID) | INTRAMUSCULAR | Status: DC
  Administered 2021-01-14 – 2021-01-18 (×12): 5000 [IU] via SUBCUTANEOUS
  Filled 2021-01-14 (×12): qty 1

## 2021-01-14 MED ORDER — MORPHINE SULFATE (PF) 2 MG/ML IV SOLN: 2.0000 mg | INTRAVENOUS | Status: DC | PRN

## 2021-01-14 MED ORDER — OXYCODONE HCL 5 MG PO TABS
5.0000 mg | ORAL_TABLET | ORAL | Status: DC | PRN
  Administered 2021-01-14 – 2021-01-16 (×4): 10 mg via ORAL
  Administered 2021-01-16: 5 mg via ORAL
  Administered 2021-01-17: 10 mg via ORAL
  Filled 2021-01-14 (×4): qty 2
  Filled 2021-01-14: qty 1
  Filled 2021-01-14: qty 2

## 2021-01-14 NOTE — Progress Notes (Signed)
[facility] Progress Note  2 Days Post-Op  Subjective: Flatus but no BM. NG out. 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   Objective: Vital signs in last 24 hours: Temp:  [97.6 F (36.4 C)-98.1 F (36.7 C)] 97.6 F (36.4 C) (07/28 1325) Pulse Rate:  [74-87] 85 (07/28 1325) Resp:  [19-20] 20 (07/28 1325) BP: (116-129)/(76-78) 123/78 (07/28 1325) SpO2:  [93 %-97 %] 97 % (07/28 1325) FiO2 (%):  [21 %] 21 % (07/28 1325) Last BM Date: 01/10/21  Intake/Output from previous day: 07/27 0701 - 07/28 0700 In: 0  Out: 500 [Urine:500] Intake/Output this shift: No intake/output data recorded.  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    PRN for pain IS, OOB Clears  Protonix Bid and gasx ordered LR @ 50  H&H stable  SCDs, heparin sq  Ambulate    LOS: 3 days    Ross Duke 01/14/2021

## 2021-01-15 ENCOUNTER — Inpatient Hospital Stay (HOSPITAL_COMMUNITY): Payer: Medicare Other

## 2021-01-15 MED ORDER — IOHEXOL 300 MG/ML  SOLN
75.0000 mL | Freq: Once | INTRAMUSCULAR | Status: AC | PRN
  Administered 2021-01-15: 75 mL via INTRAVENOUS

## 2021-01-15 MED ORDER — PANTOPRAZOLE SODIUM 40 MG PO TBEC
40.0000 mg | DELAYED_RELEASE_TABLET | Freq: Every day | ORAL | Status: DC
  Administered 2021-01-15 – 2021-01-18 (×4): 40 mg via ORAL
  Filled 2021-01-15 (×4): qty 1

## 2021-01-15 NOTE — Care Management Important Message (Signed)
Important Message  Patient Details  Name: Ross Duke MRN: [id number] Date of Birth: [birth year]   Medicare Important Message Given:  Yes     Tommy Medal 01/15/2021, 11:49 AM

## 2021-01-15 NOTE — Progress Notes (Signed)
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 Asked pathology to relook for nodes as I took are large section of mesentery and do not believe there could not be a lymph node in that section.   Having flatus but no BM. On full liquids now.   Objective: Vital signs in last 24 hours: Temp:  [98 F (36.7 C)-98.6 F (37 C)] 98 F (36.7 C) (07/29 0406) Pulse Rate:  [69-74] 69 (07/29 0406) Resp:  [19] 19 (07/29 0406) BP: (122-125)/(71-81) 125/71 (07/29 0406) SpO2:  [95 %-96 %] 95 % (07/29 0406) Last BM Date: 01/10/21  Intake/Output from previous day: 07/28 0701 - 07/29 0700 In: 200 [P.O.:200] Out: -  Intake/Output this shift: Total I/O In: 240 [P.O.:240] Out: -   General appearance: alert and no distress Resp: normal work of breathing GI: soft, nondistended, appropriately tender, staples c/d/I with honeycomb dressing   Lab Results:  Recent Labs    01/13/21 0502 01/14/21 0624  WBC 11.7* 12.0*  HGB 9.6* 10.0*  HCT 30.8* 32.8*  PLT 262 285   BMET Recent Labs    01/13/21 0502 01/14/21 0624  NA 139 140  K 4.4 3.5  CL 106 104  CO2 24 26  GLUCOSE 112* 82  BUN 21 21  CREATININE 1.27* 1.23  CALCIUM 8.7* 9.0    Anti-infectives: Anti-infectives (From admission, onward)    Start     Dose/Rate Route Frequency Ordered Stop   01/12/21 0600  cefoTEtan (CEFOTAN) 2 g in sodium chloride 0.9 % 100 mL IVPB        2 g 200 mL/hr over 30 Minutes Intravenous On call to O.R. 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 Asking pathology to reassess for any nodes in the mesentery.  PRN For pain IS, OOB Full lquid diet Protonix oral CT chest to assess for staging given high grade GIST, prior nodule on CT abd in the RLL area Scds, heparin sq Discussed need for Oncology referral, and  asked them to think about [facility] or [facility]  referral  Home once has BM  Education for GIST given to patient    LOS: 4 days    Ross Duke 01/15/2021

## 2021-01-16 NOTE — Progress Notes (Signed)
[facility]  NO BM but having flatus. Says he feels like if he eats more he will have BM.  CT chest with same nodule noted on CT a/p.   BP 117/73 (BP Location: Left Arm)   Pulse 65   Temp 97.9 F (36.6 C) (Oral)   Resp 19   Ht '5\' 6"'$  (1.676 m)   Wt 77.1 kg   SpO2 95%   BMI 27.44 kg/m   Soft, nondistended, appropriately tender, staples c/d/I without erythema or drainage  Colace Soft diet Likely home tomorrow Patient given CD with CT c/a/p and MRI on it and reads, given print out of the pathology Pathology looking back to see the lymph nodes in the mesentery  Hopefully home tomorrow once has BM   Patient may want to do Oncology at [facility] versus with  [facility].  Curlene Labrum, MD [facility] [address] [place], [place] [address] [phone] (office)

## 2021-01-16 NOTE — Progress Notes (Signed)
Passing gas today but no bm.  Ate small amount of supper with no nausea.  Pain around staples rated about a 5.  Ambulated several times today in hallway with wife.

## 2021-01-17 ENCOUNTER — Encounter (HOSPITAL_COMMUNITY): Payer: Self-pay | Admitting: Family Medicine

## 2021-01-17 DIAGNOSIS — D62 Acute posthemorrhagic anemia: Secondary | ICD-10-CM

## 2021-01-17 DIAGNOSIS — C49A3 Gastrointestinal stromal tumor of small intestine: Secondary | ICD-10-CM

## 2021-01-17 MED ORDER — ONDANSETRON HCL 4 MG PO TABS: 4.0000 mg | ORAL_TABLET | Freq: Four times a day (QID) | ORAL | 0 refills | Status: AC | PRN

## 2021-01-17 MED ORDER — OXYCODONE HCL 5 MG PO TABS: 5.0000 mg | ORAL_TABLET | ORAL | 0 refills | Status: DC | PRN

## 2021-01-17 MED ORDER — DOCUSATE SODIUM 100 MG PO CAPS: 100.0000 mg | ORAL_CAPSULE | Freq: Two times a day (BID) | ORAL | 0 refills | Status: AC | PRN

## 2021-01-17 MED ORDER — MAGNESIUM HYDROXIDE 400 MG/5ML PO SUSP
15.0000 mL | Freq: Once | ORAL | Status: AC
  Administered 2021-01-17: 15 mL via ORAL
  Filled 2021-01-17: qty 30

## 2021-01-17 NOTE — Discharge Summary (Addendum)
Physician Discharge Summary  Patient ID: SINAI MAHANY MRN: [id number] DOB/AGE: June 07, 1951 70 y.o.  Admit date: 01/10/2021 Discharge date: 01/18/2021  Admission Diagnoses: Abdominal mass, pain   Discharge Diagnoses:  Principal Problem:   GIST (gastrointestinal stromal tumor) of small bowel, malignant ([place]) Active Problems:   Abdominal mass   Syncope   Pulmonary nodule   Acute blood loss anemia   Discharged Condition: good  Hospital Course: Mr. Hays is a 70 yo who is very healthy and came to the hospital with acute onset of abdominal pain and a finding of a mass in the abdomen of unknown etiology. He also had some fluid in his abdomen. He was worked up with a CT a/p and recommended MRI ABD but unfortunately the hospitalist mistakenly ordered an MRI of the head. This was negative but given the need for additional contrast an abdominal MRI was not done. I discussed with him removal of the mass for diagnosis purposes. He was taken to the OR 7/26 and a small bowel resection was done. He was noted to have hemoperitoneum where the mass had bled.  The mass was large but not causing any bowel obstruction at that time. It was removed with 10cm+ margins of small bowel on each side.   Pathology came back as a high grade GIST tumor with high mitotic rate. A CT chest was done given a finding of a pulmonary nodule on the CT a/p and confirmed a single a 76mm RLL nodule that is indeterminate and needs follow up.   He has been doing well since the surgery and having flatus and advanced his diet. He has been ambulating and having pain control. He had no BM the day of discharge and MOM was given in hopes that he would have a BM before going.   He had some anemia during his stay that stabilized and was from the hemoperitoneum noted from the mass bleeding.   Pathology did not initially find any lymph nodes but given the large specimen (20+ cm of small bowel) and a large piece of mesentery, I asked them to look  back at the specimen as I do not believe there are no lymph nodes present.  This was pending at discharge.   I have discussed with him that his GIST is more high grade and has a higher risk of metastasis compared to other GIST. Disussed reasons for oncology follow up. He wants to talk to his PCP Dr. Izell [place] about Oncology. He will let us know if he wants [facility] or if he is going to [facility].   I have given him a CD with CT a/p and chest, MRI head on it. He has a copy of the initial pathology report.   Consults:  Hospitalist admission, taken over by surgery after operation   Significant Diagnostic Studies:  CLINICAL DATA:  GI stromal tumor of small bowel. Staging.   EXAM: CT CHEST WITH CONTRAST   TECHNIQUE: Multidetector CT imaging of the chest was performed during intravenous contrast administration.   CONTRAST:  105mL OMNIPAQUE IOHEXOL 300 MG/ML  SOLN   COMPARISON:  None.   FINDINGS: Cardiovascular: No acute findings. Aortic and coronary atherosclerotic calcification noted.   Mediastinum/Nodes: No masses or pathologically enlarged lymph nodes identified.   Lungs/Pleura: Subsegmental atelectasis noted in both lower lobes. A 7 mm noncalcified pulmonary nodule is seen in the anterior right lower lobe on image 80/4. No other suspicious pulmonary nodules or masses identified. No evidence of pulmonary airspace disease  or pleural effusion.   Upper Abdomen: Several small hepatic cysts are seen as well as small cyst in the visualized upper pole of the left kidney.   Musculoskeletal:  No suspicious bone lesions.   IMPRESSION: Solitary 7 mm right lower lobe pulmonary nodule, which is indeterminate. Recommend continued follow-up by chest CT in 3-6 months.   Mild bilateral lower lobe atelectasis.   Aortic Atherosclerosis (ICD10-I70.0).     Electronically Signed   By: Marlaine Hind M.D.   On: 01/15/2021 21:49  CLINICAL DATA:  Acute lower abdominal pain.    EXAM: CT ABDOMEN AND PELVIS WITH CONTRAST   TECHNIQUE: Multidetector CT imaging of the abdomen and pelvis was performed using the standard protocol following bolus administration of intravenous contrast.   CONTRAST:  178mL OMNIPAQUE IOHEXOL 300 MG/ML  SOLN   COMPARISON:  None.   FINDINGS: Lower chest: 10 x 5 mm nodule is noted in right lower lobe which may represent intrapulmonary lymph node.   Hepatobiliary: No gallstones or biliary dilatation is noted. Multiple low densities are noted throughout hepatic parenchyma most consistent with hepatic cysts.   Pancreas: Unremarkable. No pancreatic ductal dilatation or surrounding inflammatory changes.   Spleen: Normal in size without focal abnormality.   Adrenals/Urinary Tract: Adrenal glands appear normal. Left renal cyst is noted. No hydronephrosis or renal obstruction is noted. No renal or ureteral calculi are noted. Urinary bladder is unremarkable.   Stomach/Bowel: Stomach is within normal limits. Appendix appears normal. No evidence of bowel wall thickening, distention, or inflammatory changes. Diverticulosis of descending and sigmoid colon is noted.   Vascular/Lymphatic: Aortic atherosclerosis. No enlarged abdominal or pelvic lymph nodes.   Reproductive: Prostate is unremarkable.   Other: Mild ascites is noted in the pelvis, right pericolic gutter and around the liver. 8.1 x 7.1 cm abnormal soft tissue density is noted anteriorly in the pelvis with peripheral enhancement and surrounding ascites. This is highly concerning for neoplasm or malignancy, potentially mesenteric in origin.   Musculoskeletal: No acute or significant osseous findings.   IMPRESSION: 8.1 x 7.1 cm abnormal soft tissue density is noted anteriorly in the pelvis with peripheral enhancement and surrounding ascites, and this is concerning for neoplasm or malignancy such as carcinoid or desmoid tumor. MRI with and without gadolinium is recommended  for further evaluation.   Diverticulosis of descending and sigmoid colon is noted without inflammation.   Approximately 8 mm nodule is noted in right lower lobe which may represent intrapulmonary lymph node, but pulmonary nodule cannot be excluded. Non-contrast chest CT at 6-12 months is recommended. If the nodule is stable at time of repeat CT, then future CT at 18-24 months (from today's scan) is considered optional for low-risk patients, but is recommended for high-risk patients. This recommendation follows the consensus statement: Guidelines for Management of Incidental Pulmonary Nodules Detected on CT Images: From the Fleischner Society 2017; Radiology 2017; 284:228-243.   Aortic Atherosclerosis (ICD10-I70.0).     Electronically Signed   By: Marijo Conception M.D.   On: 01/10/2021 12:33   CLINICAL DATA:  Abdominal pain, follow-up on CT findings of abdominal, syncopal episodes   EXAM: MRI HEAD WITHOUT AND WITH CONTRAST   TECHNIQUE: Multiplanar, multiecho pulse sequences of the brain and surrounding structures were obtained without and with intravenous contrast.   CONTRAST:  45mL GADAVIST GADOBUTROL 1 MMOL/ML IV SOLN   COMPARISON:  None.   FINDINGS: Brain: There is no acute infarction or intracranial hemorrhage. There is no intracranial mass, mass effect, or edema.  There is no hydrocephalus or extra-axial fluid collection. Ventricles and sulci are within normal limits in size and configuration. Patchy foci of T2 hyperintensity in the supratentorial white matter are nonspecific but may reflect minor chronic microvascular ischemic changes. No abnormal enhancement.   Vascular: Major vessel flow voids at the skull base are preserved.   Skull and upper cervical spine: Normal marrow signal is preserved.   Sinuses/Orbits: Minor mucosal thickening.  Orbits are unremarkable.   Other: Sella is unremarkable. Fluid opacification right mastoid tip.   IMPRESSION: No evidence  of intracranial metastatic disease.     Electronically Signed   By: Macy Mis M.D.   On: 01/11/2021 09:11  ECHOCARDIOGRAM REPORT         Patient Name:   MITCHEL DELDUCA Date of Exam: 01/11/2021  Medical Rec #:  [id number]      Height:       66.0 in  Accession #:    [id number]     Weight:       170.0 lb  Date of Birth:  1951/01/14       BSA:          1.866 m  Patient Age:    51 years       BP:           130/79 mmHg  Patient Gender: M              HR:           80 bpm.  Exam Location:  [facility]   Procedure: 2D Echo, Cardiac Doppler and Color Doppler   Indications:    Syncope     History:        Patient has no prior history of Echocardiogram  examinations.                  Arrythmias:LBBB; Signs/Symptoms:Syncope.     Sonographer:    Wenda Low  Referring Phys: [place]     1. Left ventricular ejection fraction, by estimation, is 65 to 70%. The  left ventricle has normal function. The left ventricle has no regional  wall motion abnormalities. There is mild left ventricular hypertrophy.  Left ventricular diastolic parameters  were normal.   2. Right ventricular systolic function is normal. The right ventricular  size is normal.   3. The mitral valve is normal in structure. Trivial mitral valve  regurgitation.   4. The aortic valve is tricuspid. Aortic valve regurgitation is not  visualized. Mild to moderate aortic valve sclerosis/calcification is  present, without any evidence of aortic stenosis.   5. The inferior vena cava is normal in size with greater than 50%  respiratory variability, suggesting right atrial pressure of 3 mmHg.   FINDINGS   Left Ventricle: Left ventricular ejection fraction, by estimation, is 65  to 70%. The left ventricle has normal function. The left ventricle has no  regional wall motion abnormalities. The left ventricular internal cavity  size was normal in size. There is   mild left ventricular  hypertrophy. Left ventricular diastolic parameters  were normal.   Right Ventricle: The right ventricular size is normal. Right vetricular  wall thickness was not assessed. Right ventricular systolic function is  normal.   Left Atrium: Left atrial size was normal in size.   Right Atrium: Right atrial size was normal in size.   Pericardium: Trivial pericardial effusion is present.   Mitral Valve: The mitral valve is normal in  structure. Trivial mitral  valve regurgitation. MV peak gradient, 2.7 mmHg. The mean mitral valve  gradient is 1.0 mmHg.   Tricuspid Valve: The tricuspid valve is normal in structure. Tricuspid  valve regurgitation is trivial.   Aortic Valve: The aortic valve is tricuspid. Aortic valve regurgitation is  not visualized. Mild to moderate aortic valve sclerosis/calcification is  present, without any evidence of aortic stenosis. Aortic valve mean  gradient measures 4.0 mmHg. Aortic  valve peak gradient measures 6.4 mmHg. Aortic valve area, by VTI measures  3.06 cm.   Pulmonic Valve: The pulmonic valve was normal in structure. Pulmonic valve  regurgitation is not visualized.   Aorta: The aortic root and ascending aorta are structurally normal, with  no evidence of dilitation.   Venous: The inferior vena cava is normal in size with greater than 50%  respiratory variability, suggesting right atrial pressure of 3 mmHg.   IAS/Shunts: No atrial level shunt detected by color flow Doppler.      LEFT VENTRICLE  PLAX 2D  LVIDd:         4.19 cm  Diastology  LVIDs:         2.53 cm  LV e' medial:    7.10 cm/s  LV PW:         1.31 cm  LV E/e' medial:  10.4  LV IVS:        1.30 cm  LV e' lateral:   8.50 cm/s  LVOT diam:     2.10 cm  LV E/e' lateral: 8.7  LV SV:         87  LV SV Index:   46  LVOT Area:     3.46 cm      RIGHT VENTRICLE  RV Basal diam:  3.20 cm  RV Mid diam:    2.79 cm  RV S prime:     16.20 cm/s  TAPSE (M-mode): 2.0 cm   LEFT ATRIUM              Index       RIGHT ATRIUM           Index  LA diam:        3.60 cm 1.93 cm/m  RA Area:     16.00 cm  LA Vol (A2C):   39.7 ml 21.27 ml/m RA Volume:   40.70 ml  21.81 ml/m  LA Vol (A4C):   35.0 ml 18.75 ml/m  LA Biplane Vol: 40.0 ml 21.43 ml/m   AORTIC VALVE  AV Area (Vmax):    3.38 cm  AV Area (Vmean):   3.05 cm  AV Area (VTI):     3.06 cm  AV Vmax:           126.00 cm/s  AV Vmean:          101.000 cm/s  AV VTI:            0.283 m  AV Peak Grad:      6.4 mmHg  AV Mean Grad:      4.0 mmHg  LVOT Vmax:         123.00 cm/s  LVOT Vmean:        88.900 cm/s  LVOT VTI:          0.250 m  LVOT/AV VTI ratio: 0.88     AORTA  Ao Root diam: 3.90 cm  Ao Asc diam:  3.60 cm   MITRAL VALVE  MV Area (PHT): 2.81 cm  SHUNTS  MV Area VTI:   3.59 cm    Systemic VTI:  0.25 m  MV Peak grad:  2.7 mmHg    Systemic Diam: 2.10 cm  MV Mean grad:  1.0 mmHg  MV Vmax:       0.82 m/s  MV Vmean:      49.4 cm/s  MV Decel Time: 270 msec  MV E velocity: 74.10 cm/s  MV A velocity: 68.60 cm/s  MV E/A ratio:  1.08   Dorris Carnes MD  Electronically signed by Dorris Carnes MD  Signature Date/Time: 01/11/2021/4:10:36 PM    Labs: Results for JAMORION, GOMILLION (MRN [id number]) as of 01/17/2021 12:27  Ref. Range 01/10/2021 11:30 01/11/2021 04:53 01/13/2021 05:02 01/14/2021 06:24  WBC Latest Ref Range: 4.0 - 10.5 K/uL 15.3 (H) 10.4 11.7 (H) 12.0 (H)  RBC Latest Ref Range: 4.22 - 5.81 MIL/uL 5.35 4.44 4.18 (L) 4.51  Hemoglobin Latest Ref Range: 13.0 - 17.0 g/dL 12.0 (L) 10.0 (L) 9.6 (L) 10.0 (L)  HCT Latest Ref Range: 39.0 - 52.0 % 38.8 (L) 32.0 (L) 30.8 (L) 32.8 (L)  MCV Latest Ref Range: 80.0 - 100.0 fL 72.5 (L) 72.1 (L) 73.7 (L) 72.7 (L)  MCH Latest Ref Range: 26.0 - 34.0 pg 22.4 (L) 22.5 (L) 23.0 (L) 22.2 (L)  MCHC Latest Ref Range: 30.0 - 36.0 g/dL 30.9 31.3 31.2 30.5  RDW Latest Ref Range: 11.5 - 15.5 % 14.5 14.5 14.6 14.4  Platelets Latest Ref Range: 150 - 400 K/uL 268 244 262 285  nRBC Latest Ref Range:  0.0 - 0.2 % 0.0 0.0 0.0 0.0  Neutrophils Latest Units: % 74  79 62  Lymphocytes Latest Units: % $Remove'17  12 28  'zKQQSYz$ Monocytes Relative Latest Units: % $Remove'6  8 8  'yaAHerK$ Eosinophil Latest Units: % 1  0 1  Basophil Latest Units: % 1  0 1  Immature Granulocytes Latest Units: % 1  1 0  NEUT# Latest Ref Range: 1.7 - 7.7 K/uL 11.5 (H)  9.4 (H) 7.4  Lymphocyte # Latest Ref Range: 0.7 - 4.0 K/uL 2.6  1.4 3.4  Monocyte # Latest Ref Range: 0.1 - 1.0 K/uL 0.9  0.9 1.0  Eosinophils Absolute Latest Ref Range: 0.0 - 0.5 K/uL 0.2  0.0 0.1  Basophils Absolute Latest Ref Range: 0.0 - 0.1 K/uL 0.1  0.0 0.1  Abs Immature Granulocytes Latest Ref Range: 0.00 - 0.07 K/uL 0.08 (H)  0.06 0.05   Pathology: Clinical History: intraabdominal mass   FINAL MICROSCOPIC DIAGNOSIS:   A. SMALL BOWEL MASS, RESECTION:  - Gastrointestinal stromal tumor, high grade, spanning 8.4 cm.  - High risk assessment.  - Margins are negative.  - See oncology table.   ONCOLOGY TABLE:   GASTROINTESTINAL STROMAL TUMOR (GIST): Resection   Procedure: Small bowel resection  Tumor Focality: Unifocal  Multiple Primary Sites: Not applicable  Tumor Site: Small bowel, mid jejunum per OP note  Tumor Size: 8.4 cm  Histologic Type: Gastrointestinal stromal tumor, spindle cell type  Mitotic Rate: >5 per 5 mm (5-6 mitotic figures per 5 mm)  Histologic Grade: High-grade  Treatment Effect: No known presurgical therapy  Risk Assessment: High risk  Margins: All margins negative for tumor       Closest Margin(s) to GIST: Distal       Distance from Closest Margin (mm or cm): 8 cm  Regional Lymph Nodes: Not applicable (no lymph nodes submitted or found)  Distant Metastasis:       Distant Site(s)  Involved: Not applicable  Pathologic Stage Classification (pTNM, AJCC 8th Edition): pT3, pN not  assigned  Ancillary Studies: Can be performed upon request  Representative Tumor Block: A5  Comment(s): Immunohistochemistry is positive for CD117 (strong, diffuse)   and SMA (patchy). CD34, desmin, and S100 are negative.   (v4.2.0.0)   GROSS DESCRIPTION:   Specimen is received in formalin labeled small bowel mass, and consists  of a length of small intestine with 2 stapled resection margins,  measuring 19.5 cm in length.  Per the requisition a suture is present  designating the proximal margin.  The serosal surface is tan-gray, and  there is an 8.4 x 7.8 x 7.0 cm red-purple, bulging mass identified  protruding from the serosal surface.  A few engorged blood vessels are  noted on the surface of the bowel and mass.  The lumen contains a small  amount of tan-yellow partially digested material.  Mucosa is tan-pink  with normal folding, and the mucosa underlying the serosal lesion  displays a 3.0 x 1.8 cm firm, smooth bulging nodule.  Mucosal nodule  measures 8.0 cm the closest distal margin.  Sectioning the nodular  lesion reveals a tan-pink to red-purple, focally hemorrhagic cut  surface.  No lymph nodes are grossly identified.  Representative  sections are submitted in 10 cassettes.  1 = proximal margin  2 = distal margin  3 and 4 = mucosa with underlying lesion  5-9 = sections of lesion  10 = uninvolved mucosa (KL 01/13/2021)   Treatments: IV hydration and 7/26 Small bowel resection, resection of mass with primary side to side anastomosis, CT chest, abdomen, pelvis for work up   Discharge Exam: Blood pressure 121/73, pulse 65, temperature 98 F (36.7 C), temperature source Oral, resp. rate 16, height $RemoveBe'5\' 6"'phyrBQfhP$  (1.676 m), weight 77.1 kg, SpO2 95 %. General appearance: alert, cooperative, and no distress Resp: normal work of breathing GI: soft, nondistended, appropriately tender, staples c/d/I without erythema or drainage  Disposition: Discharge disposition: 01-Home or Self Care      Discharge Instructions     Call MD for:  difficulty breathing, headache or visual disturbances   Complete by: As directed    Call MD for:  extreme fatigue    Complete by: As directed    Call MD for:  persistant dizziness or light-headedness   Complete by: As directed    Call MD for:  persistant nausea and vomiting   Complete by: As directed    Call MD for:  redness, tenderness, or signs of infection (pain, swelling, redness, odor or green/yellow discharge around incision site)   Complete by: As directed    Call MD for:  severe uncontrolled pain   Complete by: As directed    Call MD for:  temperature >100.4   Complete by: As directed    Increase activity slowly   Complete by: As directed       Allergies as of 01/17/2021       Reactions   Naproxen Sodium    Makes him twitchy        Medication List     STOP taking these medications    predniSONE 20 MG tablet Commonly known as: DELTASONE       TAKE these medications    aspirin 81 MG EC tablet Take 1 tablet by mouth daily.   docusate sodium 100 MG capsule Commonly known as: COLACE Take 1 capsule (100 mg total) by mouth 2 (two) times daily as needed for mild  constipation.   fluticasone 50 MCG/ACT nasal spray Commonly known as: FLONASE Place 1 spray into both nostrils daily.   loratadine 10 MG tablet Commonly known as: CLARITIN Take 10 mg by mouth daily.   ondansetron 4 MG tablet Commonly known as: ZOFRAN Take 1 tablet (4 mg total) by mouth every 6 (six) hours as needed for nausea.   oxyCODONE 5 MG immediate release tablet Commonly known as: Oxy IR/ROXICODONE Take 1-2 tablets (5-10 mg total) by mouth every 4 (four) hours as needed for breakthrough pain or severe pain.   oxymetazoline 0.05 % nasal spray Commonly known as: AFRIN Place 1 spray into both nostrils 2 (two) times daily.       ASK your doctor about these medications    azithromycin 250 MG tablet Commonly known as: ZITHROMAX azithromycin 250 mg tablet  TAKE 2 TABLETS BY MOUTH TODAY, THEN TAKE 1 TABLET DAILY FOR 4 DAYS   Ocean Complete Sinus Rinse Aers Place into the nose.        Follow-up  Information     Aviva Signs, MD Follow up on 01/26/2021.   Specialty: General Surgery Why: staple removal Contact information: [address] [place] [address] [phone]         Virl Cagey, MD Follow up on 02/16/2021.   Specialty: General Surgery Why: post op check Contact information: [address] Linna Hoff [facility] [address] [phone]         Juline Patch, MD Follow up in 1 week(s).   Specialty: Internal Medicine Why: So you can talk to him about Oncology and if you want Oncology referral at [facility] or [facility]. If you want to go to [facility] then let the office know and we will set everything up for that. Contact information: [address] [place] [address] [phone]                 Signed: Virl Cagey 01/17/2021, 12:33 PM

## 2021-01-17 NOTE — Progress Notes (Signed)
[facility]  I thought patient would have a bowel movement by now and get to go home. No bowel movement yet and a little bloated. Discharge orders have been done and prescription sent in. Given that he's bloated and no bowel movement will cancel the discharge for today and hopefully will be able to discharge tomorrow.  Dr. Arnoldo Morale will be seeing patient tomorrow.  Curlene Labrum, MD

## 2021-01-17 NOTE — Discharge Instructions (Addendum)
Discharge Open Abdominal Surgery Instructions:  Common Complaints: Pain at the incision site is common. This will improve with time. Take your pain medications as described below. Some nausea is common and poor appetite. The main goal is to stay hydrated the first few days after surgery.   Diet/ Activity: Diet as tolerated. Eat low fiber and easy to digest meals for the first several weeks. You have started and tolerated a diet in the hospital, and should continue to increase what you are able to eat.   You may not have a large appetite, but it is important to stay hydrated. Drink 64 ounces of water a day. Your appetite will return with time.  Keep a dry dressing in place over your staples daily or as needed. Some minor pink/ blood tinged drainage is expected. This will stop in a few days after surgery.  Shower per your regular routine daily.  Do not take hot showers. Take warm showers that are less than 10 minutes. Pat the incision dry. Wear an abdominal binder daily with activity. You do not have to wear this while sleeping or sitting.  Rest and listen to your body, but do not remain in bed all day.  Walk everyday for at least 15-20 minutes. Deep cough and move around every 1-2 hours in the first few days after surgery.  Do not lift > 10 lbs, perform excessive bending, pushing, pulling, squatting for 6-8 weeks after surgery.  The activity restrictions and the abdominal binder are to prevent hernia formation at your incision while you are healing.  Do not place lotions or balms on your incision unless instructed to specifically by Dr. Constance Haw.   Pain Expectations and Narcotics: -After surgery you will have pain associated with your incisions and this is normal. The pain is muscular and nerve pain, and will get better with time. -You are encouraged and expected to take non narcotic medications like tylenol and ibuprofen (when able) to treat pain as multiple modalities can aid with pain  treatment. -Narcotics are only used when pain is severe or there is breakthrough pain. -You are not expected to have a pain score of 0 after surgery, as we cannot prevent pain. A pain score of 3-4 that allows you to be functional, move, walk, and tolerate some activity is the goal. The pain will continue to improve over the days after surgery and is dependent on your surgery. -Due to [place] law, we are only able to give a certain amount of pain medication to treat post operative pain, and we only give additional narcotics on a patient by patient basis.  -For most laparoscopic surgery, studies have shown that the majority of patients only need 10-15 narcotic pills, and for open surgeries most patients only need 15-20.   -Having appropriate expectations of pain and knowledge of pain management with non narcotics is important as we do not want anyone to become addicted to narcotic pain medication.  -Using ice packs in the first 48 hours and heating pads after 48 hours, wearing an abdominal binder (when recommended), and using over the counter medications are all ways to help with pain management.   -Simple acts like meditation and mindfulness practices after surgery can also help with pain control and research has proven the benefit of these practices.  Medication: Take tylenol and ibuprofen as needed for pain control, alternating every 4-6 hours.  Example:  Tylenol '1000mg'$  @ 6am, 12noon, 6pm, 51mdnight (Do not exceed '4000mg'$  of tylenol a day). Ibuprofen '800mg'$  @  9am, 3pm, 9pm, 3am (Do not exceed '3600mg'$  of ibuprofen a day).  Take Roxicodone for breakthrough pain every 4 hours.  Take Colace for constipation related to narcotic pain medication. If you do not have a bowel movement in 2 days, take Miralax over the counter.  Drink plenty of water to also prevent constipation.   Contact Information: If you have questions or concerns, please call our office, [phone], Monday- Thursday 8AM-5PM and Friday  8AM-12Noon.  If it is after hours or on the weekend, please call [facility], [phone], [phone], and ask to speak to the surgeon on call for Dr. Constance Haw at [facility].    Set up an appt with Dr. Izell [place] so you can talk to him about an Oncology referral. You need to decide if you want Oncology referral at [facility] or [facility]. If you want to go to [facility] then let my office know and we will set everything up for that.

## 2021-01-18 NOTE — Progress Notes (Signed)
After drinking warmed prune juice, had large, soft, formed bm with no blood noted.  Staples intact, no signs of infection. IV removed and discharge instructions reviewed.  Scripts sent to pharmacy and follow-up appointments scheduled.  Transported by WC to main entrance.  Wife to drive home

## 2021-01-18 NOTE — Care Management Important Message (Signed)
Important Message  Patient Details  Name: MAXXIMUS BOEDING MRN: [id number] Date of Birth: 1950-10-26   Medicare Important Message Given:  Yes     Tommy Medal 01/18/2021, 12:18 PM

## 2021-01-19 LAB — SURGICAL PATHOLOGY

## 2021-01-26 ENCOUNTER — Other Ambulatory Visit: Payer: Self-pay

## 2021-01-26 ENCOUNTER — Encounter: Payer: Self-pay | Admitting: General Surgery

## 2021-01-26 ENCOUNTER — Ambulatory Visit (INDEPENDENT_AMBULATORY_CARE_PROVIDER_SITE_OTHER): Payer: Medicare Other | Admitting: General Surgery

## 2021-01-26 VITALS — BP 127/82 | HR 76 | Temp 97.8°F | Resp 16 | Ht 66.0 in | Wt 169.6 lb

## 2021-01-26 DIAGNOSIS — Z09 Encounter for follow-up examination after completed treatment for conditions other than malignant neoplasm: Secondary | ICD-10-CM

## 2021-01-26 MED ORDER — OXYCODONE HCL 5 MG PO TABS: 5.0000 mg | ORAL_TABLET | ORAL | 0 refills | Status: AC | PRN

## 2021-01-26 NOTE — Progress Notes (Signed)
Subjective:     Ross Duke  Patient here for postoperative visit, status post exploratory laparotomy, removal of GIST tumor.  Patient is doing well.  His appetite is improving.  He denies any fever or chills.  He is following up next week with [facility] oncology. Objective:    BP 127/82   Pulse 76   Temp 97.8 F (36.6 C) (Oral)   Resp 16   Ht '5\' 6"'$  (1.676 m)   Wt 169 lb 9.6 oz (76.9 kg)   SpO2 98%   BMI 27.37 kg/m   General:  alert, cooperative, and no distress  Abdomen is soft, incision healing well.  Staples removed, Steri-Strips applied. A copy of the pathology report was given to the patient.     Assessment:    Doing well postoperatively.    Plan:   Increase activity as able, but avoid lifting anything significantly heavy over 20 pounds.  Follow-up with Dr. Constance Haw on 02/16/2021.  His oxycodone has been reordered.

## 2021-02-16 ENCOUNTER — Encounter: Payer: Medicare Other | Admitting: General Surgery

## 2021-03-02 ENCOUNTER — Ambulatory Visit (INDEPENDENT_AMBULATORY_CARE_PROVIDER_SITE_OTHER): Payer: Medicare Other | Admitting: General Surgery

## 2021-03-02 ENCOUNTER — Encounter: Payer: Self-pay | Admitting: General Surgery

## 2021-03-02 ENCOUNTER — Other Ambulatory Visit: Payer: Self-pay

## 2021-03-02 VITALS — BP 132/85 | HR 63 | Temp 98.2°F | Resp 12 | Ht 66.0 in | Wt 175.0 lb

## 2021-03-02 DIAGNOSIS — C49A3 Gastrointestinal stromal tumor of small intestine: Secondary | ICD-10-CM

## 2021-03-02 NOTE — Progress Notes (Signed)
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 He is doing well and eating. He has regular Bms. He followed up with Oncology at [facility] and is planning on starting an [facility]. He sees them again soon. He is otherwise doing well.  BP 132/85   Pulse 63   Temp 98.2 F (36.8 C) (Other (Comment))   Resp 12   Ht '5\' 6"'$  (1.676 m)   Wt 175 lb (79.4 kg)   SpO2 95%   BMI 28.25 kg/m  Healed incision, no hernia, mild induration and tenderness  Follow up with Oncology. Diet and activity as tolerated.   Call with questions or concerns, PRN follow up.  Curlene Labrum, MD [facility] [address] [place], [place] [address] [phone] (office)

## 2021-03-02 NOTE — Patient Instructions (Signed)
Follow up with Oncology. Diet and activity as tolerated.

## 2023-03-27 IMAGING — CT CT CHEST W/ CM
2 of 3 series · 15 of 36 positions shown, 18 images · IV contrast (omnipaque)
Comparison: None.

CLINICAL DATA: GI stromal tumor of small bowel. Staging.

EXAM:
CT CHEST WITH CONTRAST
TECHNIQUE: Multidetector CT imaging of the chest was performed during
intravenous contrast administration.
CONTRAST:  75mL OMNIPAQUE IOHEXOL 300 MG/ML  SOLN

[Series 2: routine chest with · axial · 0.80mm/px · z∈[+1221,+1499]mm · 12 of 165 slices shown, 15 images]
[im 13/165  mediastinal]
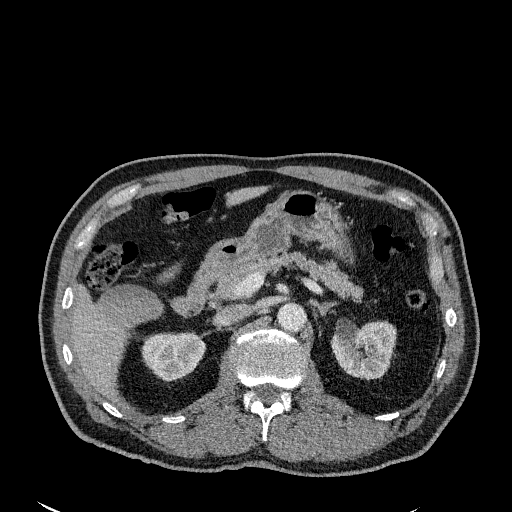
[im 13/165  lung]
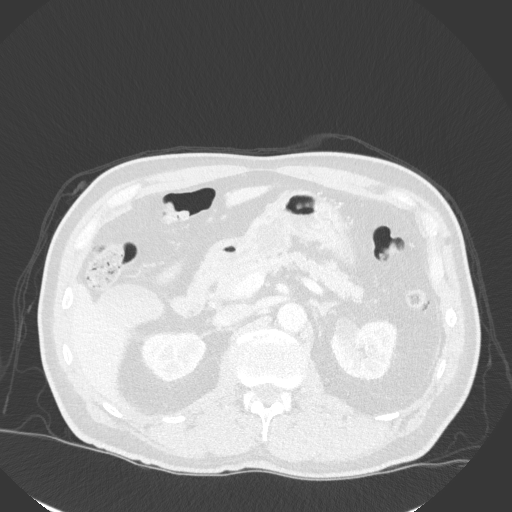
[im 25/165  lung]
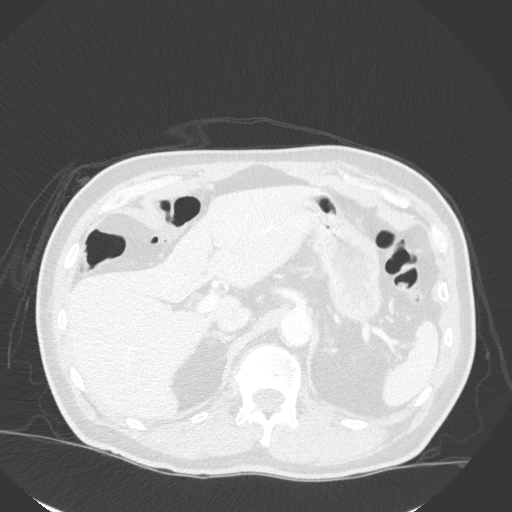
[im 37/165  lung]
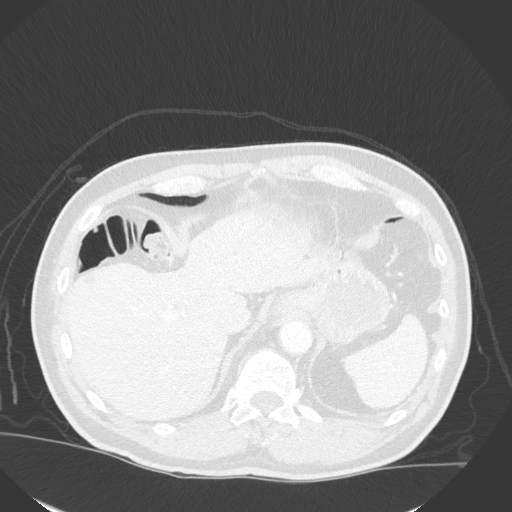
[im 49/165  lung]
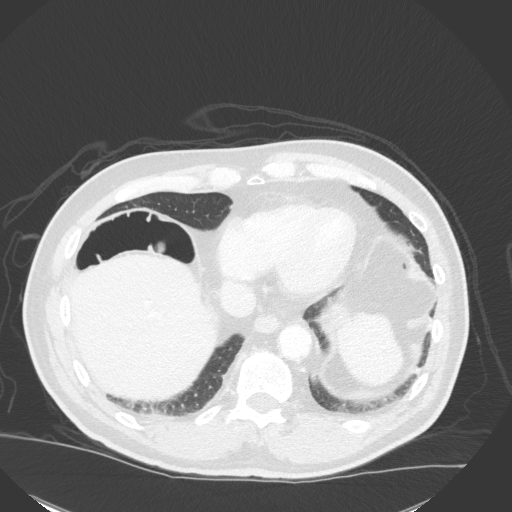
[im 61/165  mediastinal]
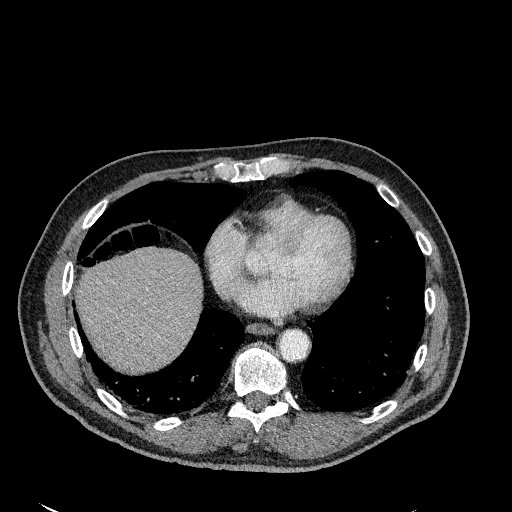
[im 61/165  lung]
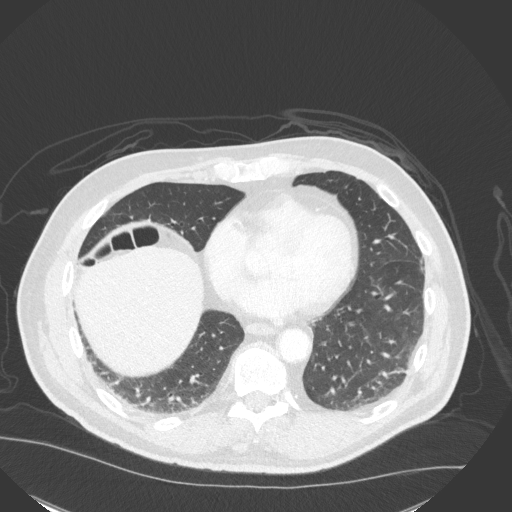
[im 73/165  lung]
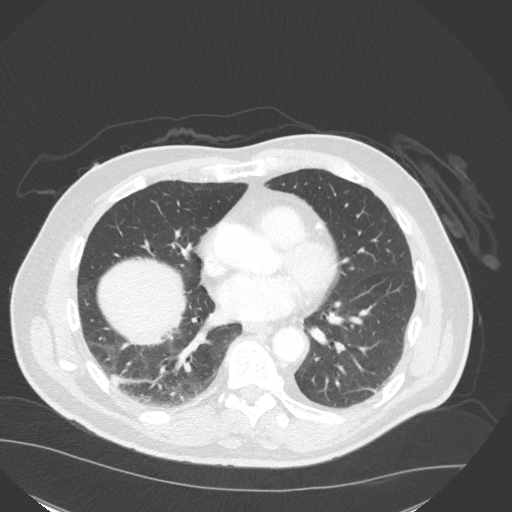
[im 92/165  lung]
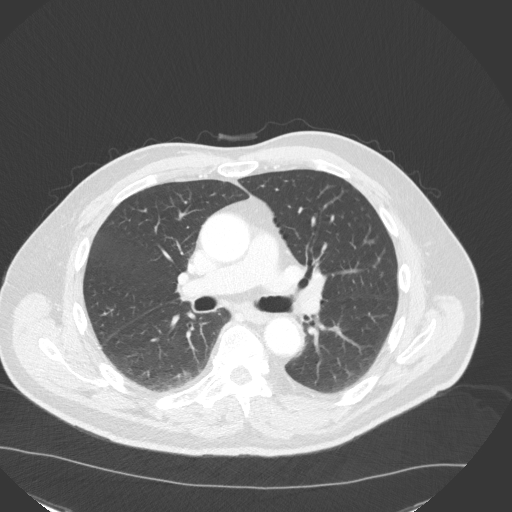
[im 104/165  lung]
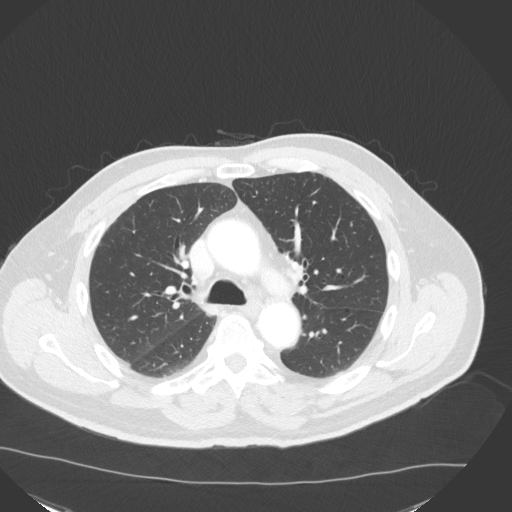
[im 116/165  mediastinal]
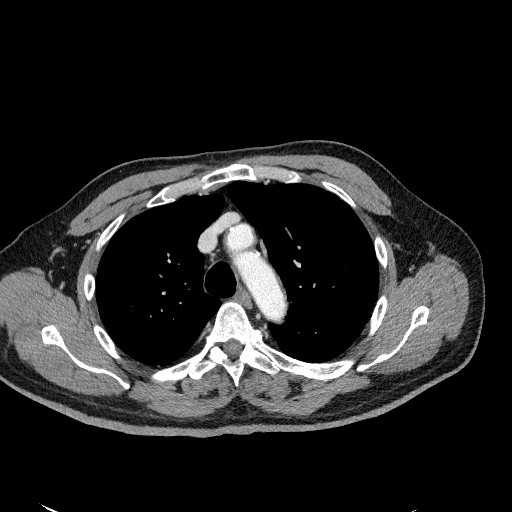
[im 116/165  lung]
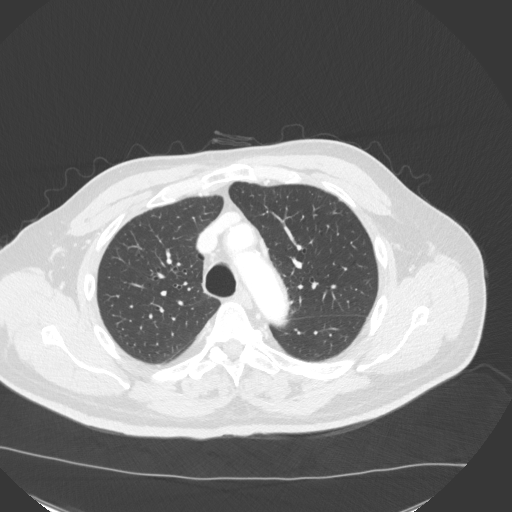
[im 128/165  lung]
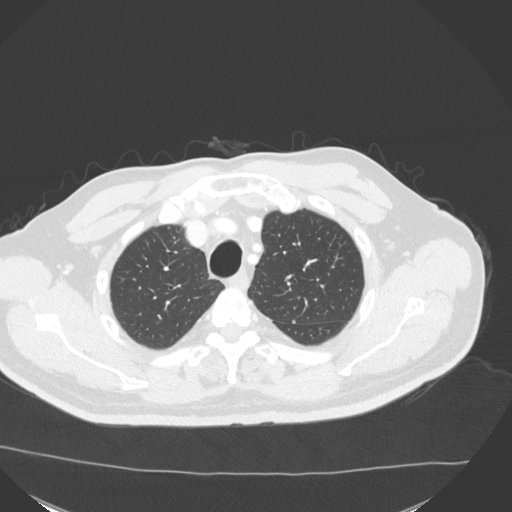
[im 140/165  lung]
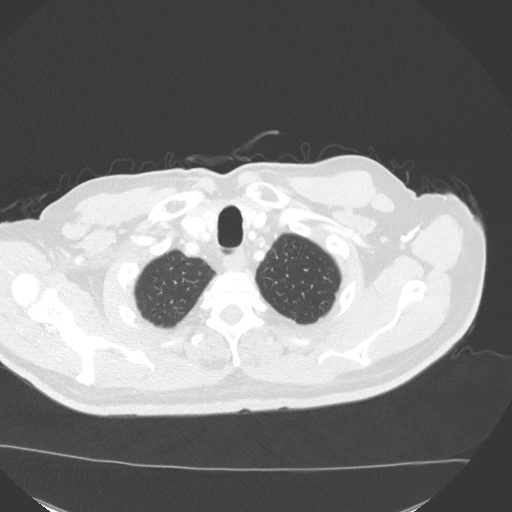
[im 152/165  lung]
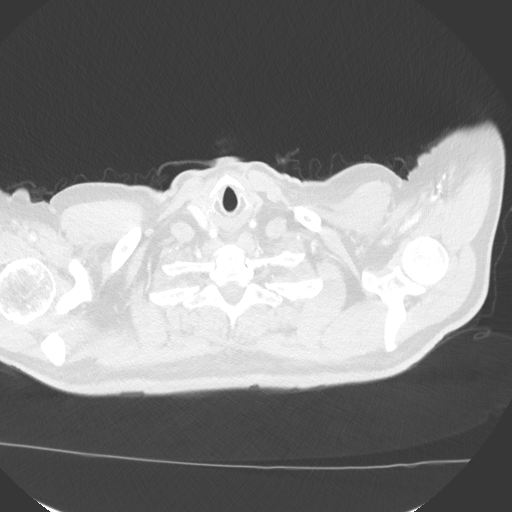

[Series 5: coronal · coronal · 0.72mm/px · 3 of 147 slices shown]
[im 30/147  lung]
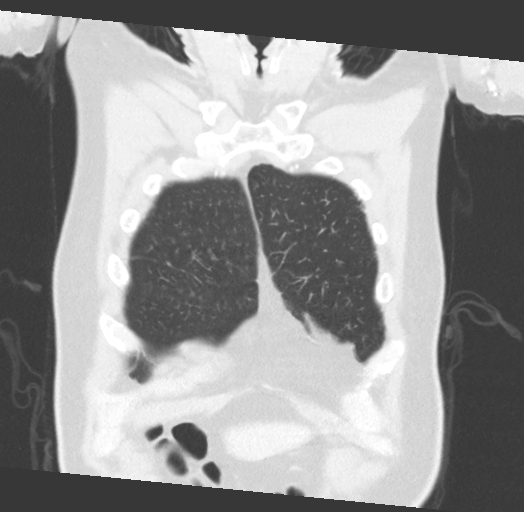
[im 59/147  lung]
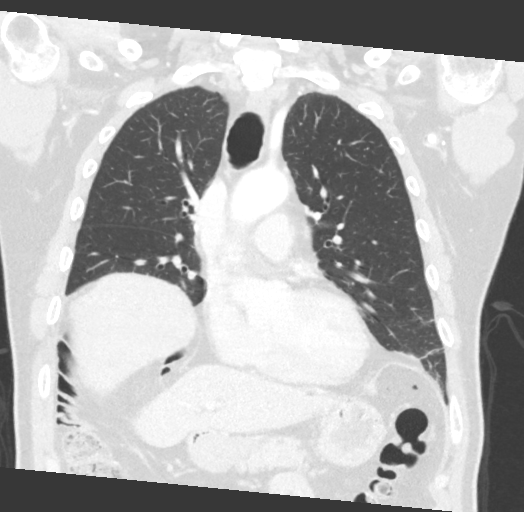
[im 88/147  lung]
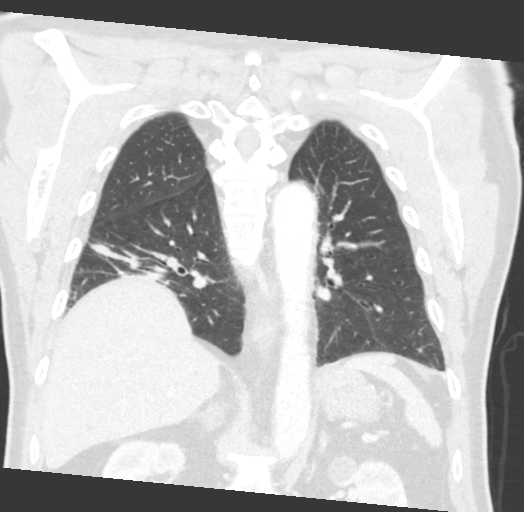

[15 of 36 positions shown; findings below may reference images not displayed]

FINDINGS: Cardiovascular: No acute findings. Aortic and coronary
atherosclerotic calcification noted.

Mediastinum/Nodes: No masses or pathologically enlarged lymph nodes
identified.

Lungs/Pleura: Subsegmental atelectasis noted in both lower lobes. A
7 mm noncalcified pulmonary nodule is seen in the anterior right
lower lobe on image 80/4. No other suspicious pulmonary nodules or
masses identified. No evidence of pulmonary airspace disease or
pleural effusion.

Upper Abdomen: Several small hepatic cysts are seen as well as small
cyst in the visualized upper pole of the left kidney.

Musculoskeletal:  No suspicious bone lesions.
IMPRESSION: Solitary 7 mm right lower lobe pulmonary nodule, which is
indeterminate. Recommend continued follow-up by chest CT in 3-6
months.

Mild bilateral lower lobe atelectasis.

Aortic Atherosclerosis ([id number]).
